# Patient Record
Sex: Male | Born: 1960 | Race: White | Hispanic: No | Marital: Married | Smoking: Never smoker
Health system: Southern US, Community
[De-identification: ages and names within clinical notes are randomized; demographics above are authoritative.]

## PROBLEM LIST (undated history)

## (undated) DIAGNOSIS — C801 Malignant (primary) neoplasm, unspecified: Secondary | ICD-10-CM

## (undated) DIAGNOSIS — E119 Type 2 diabetes mellitus without complications: Secondary | ICD-10-CM

## (undated) DIAGNOSIS — I10 Essential (primary) hypertension: Secondary | ICD-10-CM

## (undated) DIAGNOSIS — M1990 Unspecified osteoarthritis, unspecified site: Secondary | ICD-10-CM

## (undated) DIAGNOSIS — H409 Unspecified glaucoma: Secondary | ICD-10-CM

## (undated) DIAGNOSIS — N529 Male erectile dysfunction, unspecified: Secondary | ICD-10-CM

## (undated) DIAGNOSIS — R972 Elevated prostate specific antigen [PSA]: Secondary | ICD-10-CM

## (undated) DIAGNOSIS — E559 Vitamin D deficiency, unspecified: Secondary | ICD-10-CM

## (undated) DIAGNOSIS — E785 Hyperlipidemia, unspecified: Secondary | ICD-10-CM

## (undated) DIAGNOSIS — Z87442 Personal history of urinary calculi: Secondary | ICD-10-CM

## (undated) HISTORY — DX: Type 2 diabetes mellitus without complications: E11.9

## (undated) HISTORY — PX: APPENDECTOMY: SHX54

## (undated) HISTORY — DX: Unspecified glaucoma: H40.9

## (undated) HISTORY — DX: Essential (primary) hypertension: I10

## (undated) HISTORY — DX: Hyperlipidemia, unspecified: E78.5

## (undated) HISTORY — DX: Vitamin D deficiency, unspecified: E55.9

---

## 2003-09-07 ENCOUNTER — Emergency Department (HOSPITAL_COMMUNITY): Admission: EM | Admit: 2003-09-07 | Discharge: 2003-09-07 | Payer: Self-pay | Admitting: Emergency Medicine

## 2003-09-12 ENCOUNTER — Encounter (HOSPITAL_COMMUNITY): Admission: RE | Admit: 2003-09-12 | Discharge: 2003-10-12 | Payer: Self-pay | Admitting: Orthopedic Surgery

## 2003-10-30 ENCOUNTER — Encounter (HOSPITAL_COMMUNITY): Admission: RE | Admit: 2003-10-30 | Discharge: 2003-11-29 | Payer: Self-pay | Admitting: Orthopedic Surgery

## 2006-02-06 ENCOUNTER — Emergency Department (HOSPITAL_COMMUNITY): Admission: EM | Admit: 2006-02-06 | Discharge: 2006-02-06 | Payer: Self-pay | Admitting: Emergency Medicine

## 2006-10-04 ENCOUNTER — Ambulatory Visit: Payer: Self-pay | Admitting: Internal Medicine

## 2011-06-02 ENCOUNTER — Telehealth: Payer: Self-pay

## 2011-06-02 NOTE — Telephone Encounter (Signed)
LMOM for pt to call. 

## 2011-06-16 NOTE — Telephone Encounter (Signed)
Letter mailed for pt to call.  

## 2012-03-17 ENCOUNTER — Encounter (INDEPENDENT_AMBULATORY_CARE_PROVIDER_SITE_OTHER): Payer: Self-pay | Admitting: *Deleted

## 2013-08-28 ENCOUNTER — Encounter (INDEPENDENT_AMBULATORY_CARE_PROVIDER_SITE_OTHER): Payer: Self-pay | Admitting: *Deleted

## 2014-03-09 ENCOUNTER — Other Ambulatory Visit (HOSPITAL_COMMUNITY): Payer: Self-pay | Admitting: Internal Medicine

## 2014-03-09 DIAGNOSIS — K4090 Unilateral inguinal hernia, without obstruction or gangrene, not specified as recurrent: Secondary | ICD-10-CM

## 2014-03-09 DIAGNOSIS — N433 Hydrocele, unspecified: Secondary | ICD-10-CM

## 2014-03-12 ENCOUNTER — Ambulatory Visit (HOSPITAL_COMMUNITY): Admission: RE | Admit: 2014-03-12 | Payer: 59 | Source: Ambulatory Visit

## 2014-03-12 ENCOUNTER — Ambulatory Visit (HOSPITAL_COMMUNITY): Payer: 59

## 2014-03-12 ENCOUNTER — Other Ambulatory Visit (HOSPITAL_COMMUNITY): Payer: Self-pay | Admitting: Internal Medicine

## 2014-03-12 DIAGNOSIS — N433 Hydrocele, unspecified: Secondary | ICD-10-CM

## 2014-03-12 DIAGNOSIS — K4090 Unilateral inguinal hernia, without obstruction or gangrene, not specified as recurrent: Secondary | ICD-10-CM

## 2014-03-16 ENCOUNTER — Ambulatory Visit (HOSPITAL_COMMUNITY)
Admission: RE | Admit: 2014-03-16 | Discharge: 2014-03-16 | Disposition: A | Discharge status: Home / Self Care | Payer: 59 | Source: Ambulatory Visit | Attending: Internal Medicine | Admitting: Internal Medicine

## 2014-03-16 ENCOUNTER — Other Ambulatory Visit (HOSPITAL_COMMUNITY): Payer: Self-pay

## 2014-03-16 DIAGNOSIS — N509 Disorder of male genital organs, unspecified: Secondary | ICD-10-CM | POA: Diagnosis not present

## 2014-03-16 DIAGNOSIS — N433 Hydrocele, unspecified: Secondary | ICD-10-CM

## 2014-03-16 DIAGNOSIS — K4090 Unilateral inguinal hernia, without obstruction or gangrene, not specified as recurrent: Secondary | ICD-10-CM | POA: Insufficient documentation

## 2014-05-31 ENCOUNTER — Encounter (INDEPENDENT_AMBULATORY_CARE_PROVIDER_SITE_OTHER): Payer: Self-pay | Admitting: *Deleted

## 2014-06-27 ENCOUNTER — Other Ambulatory Visit (INDEPENDENT_AMBULATORY_CARE_PROVIDER_SITE_OTHER): Payer: Self-pay | Admitting: *Deleted

## 2014-06-27 ENCOUNTER — Encounter (INDEPENDENT_AMBULATORY_CARE_PROVIDER_SITE_OTHER): Payer: Self-pay | Admitting: *Deleted

## 2014-06-27 DIAGNOSIS — Z1211 Encounter for screening for malignant neoplasm of colon: Secondary | ICD-10-CM

## 2014-07-30 ENCOUNTER — Telehealth (INDEPENDENT_AMBULATORY_CARE_PROVIDER_SITE_OTHER): Payer: Self-pay | Admitting: *Deleted

## 2014-07-30 DIAGNOSIS — Z1211 Encounter for screening for malignant neoplasm of colon: Secondary | ICD-10-CM

## 2014-07-30 NOTE — Telephone Encounter (Signed)
Patient needs movi prep 

## 2014-08-02 ENCOUNTER — Telehealth (INDEPENDENT_AMBULATORY_CARE_PROVIDER_SITE_OTHER): Payer: Self-pay | Admitting: *Deleted

## 2014-08-02 MED ORDER — PEG-KCL-NACL-NASULF-NA ASC-C 100 G PO SOLR: 1.0000 | Freq: Once | ORAL | Status: DC

## 2014-08-02 NOTE — Telephone Encounter (Signed)
agree

## 2014-08-02 NOTE — Telephone Encounter (Signed)
Referring MD/PCP: hall   Procedure: tcs  Reason/Indication:  screening  Has patient had this procedure before?  no  If so, when, by whom and where?    Is there a family history of colon cancer?  no  Who?  What age when diagnosed?    Is patient diabetic?   no      Does patient have prosthetic heart valve?  no  Do you have a pacemaker?  no  Has patient ever had endocarditis? no  Has patient had joint replacement within last 12 months?  no  Does patient tend to be constipated or take laxatives? no  Is patient on Coumadin, Plavix and/or Aspirin? yes  Medications: asa 81 mg daily, valsartan/hctz 80/12.5 mg daily, fish oil daily, multi vit  Allergies: nkda  Medication Adjustment: asa 2 days  Procedure date & time: 08/29/14 at 830

## 2014-08-28 NOTE — OR Nursing (Signed)
Patient called and said he needed to cancel his colonoscopy for 08/29/2014 as he has a virus. Patient stated he would call the office when he is ready to reschedule. Lacretia Nicks at Dr. Olevia Perches office notified.

## 2014-08-29 ENCOUNTER — Encounter (HOSPITAL_COMMUNITY): Admission: RE | Payer: Self-pay | Source: Ambulatory Visit

## 2014-08-29 ENCOUNTER — Ambulatory Visit (HOSPITAL_COMMUNITY): Admission: RE | Admit: 2014-08-29 | Payer: 59 | Source: Ambulatory Visit | Admitting: Internal Medicine

## 2014-08-29 SURGERY — COLONOSCOPY
Anesthesia: Moderate Sedation

## 2019-02-22 ENCOUNTER — Encounter: Payer: Self-pay | Admitting: Internal Medicine

## 2019-03-22 ENCOUNTER — Encounter: Payer: Self-pay | Admitting: Internal Medicine

## 2019-03-22 ENCOUNTER — Telehealth: Payer: Self-pay | Admitting: Internal Medicine

## 2019-03-22 ENCOUNTER — Ambulatory Visit: Payer: 59 | Admitting: Gastroenterology

## 2019-03-22 NOTE — Telephone Encounter (Signed)
Patient was a no show and letter sent  °

## 2019-03-22 NOTE — Progress Notes (Deleted)
Primary Care Physician:  [facility] [place]  Primary Gastroenterologist:  Garfield Cornea, MD   No chief complaint on file.   HPI:  William Cervantes is a 58 y.o. male here   Current Outpatient Medications  Medication Sig Dispense Refill  . peg 3350 powder (MOVIPREP) 100 G SOLR Take 1 kit (200 g total) by mouth once. 1 kit 0   No current facility-administered medications for this visit.     Allergies as of 03/22/2019  . (Not on File)    No past medical history on file.  *** The histories are not reviewed yet. Please review them in the "History" navigator section and refresh this [place].  No family history on file.  Social History   Socioeconomic History  . Marital status: Married    Spouse name: Not on file  . Number of children: Not on file  . Years of education: Not on file  . Highest education level: Not on file  Occupational History  . Not on file  Social Needs  . Financial resource strain: Not on file  . Food insecurity    Worry: Not on file    Inability: Not on file  . Transportation needs    Medical: Not on file    Non-medical: Not on file  Tobacco Use  . Smoking status: Not on file  Substance and Sexual Activity  . Alcohol use: Not on file  . Drug use: Not on file  . Sexual activity: Not on file  Lifestyle  . Physical activity    Days per week: Not on file    Minutes per session: Not on file  . Stress: Not on file  Relationships  . Social Herbalist on phone: Not on file    Gets together: Not on file    Attends religious service: Not on file    Active member of club or organization: Not on file    Attends meetings of clubs or organizations: Not on file    Relationship status: Not on file  . Intimate partner violence    Fear of current or ex partner: Not on file    Emotionally abused: Not on file    Physically abused: Not on file    Forced sexual activity: Not on file  Other Topics Concern  . Not on file  Social History  Narrative  . Not on file      ROS:  General: Negative for anorexia, weight loss, fever, chills, fatigue, weakness. Eyes: Negative for vision changes.  ENT: Negative for hoarseness, difficulty swallowing , nasal congestion. CV: Negative for chest pain, angina, palpitations, dyspnea on exertion, peripheral edema.  Respiratory: Negative for dyspnea at rest, dyspnea on exertion, cough, sputum, wheezing.  GI: See history of present illness. GU:  Negative for dysuria, hematuria, urinary incontinence, urinary frequency, nocturnal urination.  MS: Negative for joint pain, low back pain.  Derm: Negative for rash or itching.  Neuro: Negative for weakness, abnormal sensation, seizure, frequent headaches, memory loss, confusion.  Psych: Negative for anxiety, depression, suicidal ideation, hallucinations.  Endo: Negative for unusual weight change.  Heme: Negative for bruising or bleeding. Allergy: Negative for rash or hives.    Physical Examination:  There were no vitals taken for this visit.   General: Well-nourished, well-developed in no acute distress.  Head: Normocephalic, atraumatic.   Eyes: Conjunctiva pink, no icterus. Mouth: Oropharyngeal mucosa moist and pink , no lesions erythema or exudate. Neck: Supple without thyromegaly, masses, or lymphadenopathy.  Lungs: Clear to auscultation bilaterally.  Heart: Regular rate and rhythm, no murmurs rubs or gallops.  Abdomen: Bowel sounds are normal, nontender, nondistended, no hepatosplenomegaly or masses, no abdominal bruits or    hernia , no rebound or guarding.   Rectal: *** Extremities: No lower extremity edema. No clubbing or deformities.  Neuro: Alert and oriented x 4 , grossly normal neurologically.  Skin: Warm and dry, no rash or jaundice.   Psych: Alert and cooperative, normal mood and affect.  Labs: ***  Imaging Studies: No results found.

## 2019-04-21 ENCOUNTER — Other Ambulatory Visit: Payer: Self-pay

## 2019-04-21 ENCOUNTER — Ambulatory Visit (INDEPENDENT_AMBULATORY_CARE_PROVIDER_SITE_OTHER): Payer: 59 | Admitting: Gastroenterology

## 2019-04-21 ENCOUNTER — Encounter: Payer: Self-pay | Admitting: Gastroenterology

## 2019-04-21 DIAGNOSIS — Z1211 Encounter for screening for malignant neoplasm of colon: Secondary | ICD-10-CM | POA: Insufficient documentation

## 2019-04-21 DIAGNOSIS — K769 Liver disease, unspecified: Secondary | ICD-10-CM | POA: Diagnosis not present

## 2019-04-21 NOTE — Progress Notes (Signed)
Primary Care Physician:  [facility] [place], Elisabeth Most Requesting provider: Cynda Familia Primary Gastroenterologist:  Garfield Cornea, MD   Chief Complaint  Patient presents with  . abnormal liver imaging    HPI:  William Cervantes is a 58 y.o. male here at the request of Cynda Familia, [place], for further evaluation of abnormal liver imaging.  Patient states he has had some intermittent left flank pain which initially began about 2 years ago.  He had to visit a [place] center in [place] when he was on vacation.  He states he was told that he may have kidney stones based on x-rays.  He followed up at the [facility], saw urology.  Advised that he had an enlarged prostate.  Ultrasound abd? was unremarkable.   Recently switched from [facility] to [place].  PCP there recommended CT abdomen and pelvis without contrast.  CT done on August 2020, it showed mild nodular contour of the liver which can be seen with cirrhosis.  Tiny calcified gallstones.  Severe sigmoid colon diverticulosis.  Engorged left retroperitoneal vessels communicating with the left renal vein, left gonadal vein, splenic vein suggesting portosystemic shunting.  States he was advised to follow-up with GI for further evaluation of his liver.  In the meantime it was noted that his A1c had climbed to 7.9.  He states he started walking a mile every day, watching his diet.  His last A1c was 6.6.  He states he is lost about 5 to 6 pounds.  He denies any abdominal pain.  His appetite is good.  No bowel concerns.  No melena or rectal bleeding.  No heartburn or dysphagia.  No nausea or vomiting. No family history of colon cancer, liver disease.  No prior regular heavy alcohol use.  No history of drug use.  Patient has never had a colonoscopy.  He has been willing for colon cancer screening to be Hemoccults.  He has completed these in the past stating they were negative.  He states he is overdue now.  We discussed  possibility of Cologuard testing.  He states he is willing to pursue colonoscopy if one of the screening exams were positive.    Patient states Lipitor was recently started.  His LDL has been normal.  He believes it was started as a preventative medication.   Current Outpatient Medications  Medication Sig Dispense Refill  . aspirin EC 81 MG tablet Take 81 mg by mouth daily.    Marland Kitchen atorvastatin (LIPITOR) 20 MG tablet Take 1 tablet by mouth daily.    . diclofenac (VOLTAREN) 75 MG EC tablet Take 75 mg by mouth 2 (two) times daily as needed.    . Ergocalciferol (VITAMIN D2) 10 MCG (400 UNIT) TABS Take by mouth daily.    Marland Kitchen glipiZIDE (GLUCOTROL) 10 MG tablet Take 10 mg by mouth 2 (two) times daily before a meal.    . hydrochlorothiazide (HYDRODIURIL) 25 MG tablet Take 0.5 tablets by mouth daily.    Marland Kitchen losartan (COZAAR) 25 MG tablet Take 1 tablet by mouth daily.    . metFORMIN (GLUCOPHAGE) 1000 MG tablet Take 1 tablet by mouth 2 (two) times daily.    . Multiple Vitamins-Minerals (MULTIVITAMIN ADULTS 50+ PO) Take by mouth daily.    . Omega-3 Fatty Acids (FISH OIL ULTRA) 1400 MG CAPS Take by mouth daily.    . tamsulosin (FLOMAX) 0.4 MG CAPS capsule Take 1 capsule by mouth daily.    . TRAVATAN Z 0.004 % SOLN  ophthalmic solution Place 1 drop into both eyes at bedtime.     No current facility-administered medications for this visit.     Allergies as of 04/21/2019 - Review Complete 04/21/2019  Allergen Reaction Noted  . Sulfa antibiotics Swelling 04/21/2019    Past Medical History:  Diagnosis Date  . Diabetes mellitus ([place])   . Glaucoma   . Hyperlipidemia   . Hypertension   . Vitamin D deficiency     Past Surgical History:  Procedure Laterality Date  . APPENDECTOMY      Family History  Problem Relation Age of Onset  . Diabetes Mother   . Hypertension Father   . Heart attack Father        passed away at 38 with massive heart attack  . Colon cancer Neg Hx   . Liver disease Neg Hx      Social History   Socioeconomic History  . Marital status: Married    Spouse name: Not on file  . Number of children: Not on file  . Years of education: Not on file  . Highest education level: Not on file  Occupational History  . Not on file  Social Needs  . Financial resource strain: Not on file  . Food insecurity    Worry: Not on file    Inability: Not on file  . Transportation needs    Medical: Not on file    Non-medical: Not on file  Tobacco Use  . Smoking status: Never Smoker  . Smokeless tobacco: Never Used  Substance and Sexual Activity  . Alcohol use: Yes    Comment: occ beer, never had heavy use.   . Drug use: Never  . Sexual activity: Not on file  Lifestyle  . Physical activity    Days per week: Not on file    Minutes per session: Not on file  . Stress: Not on file  Relationships  . Social Herbalist on phone: Not on file    Gets together: Not on file    Attends religious service: Not on file    Active member of club or organization: Not on file    Attends meetings of clubs or organizations: Not on file    Relationship status: Not on file  . Intimate partner violence    Fear of current or ex partner: Not on file    Emotionally abused: Not on file    Physically abused: Not on file    Forced sexual activity: Not on file  Other Topics Concern  . Not on file  Social History Narrative  . Not on file      ROS:  General: Negative for anorexia, weight loss, fever, chills, fatigue, weakness. Eyes: Negative for vision changes.  ENT: Negative for hoarseness, difficulty swallowing , nasal congestion. CV: Negative for chest pain, angina, palpitations, dyspnea on exertion, peripheral edema.  Respiratory: Negative for dyspnea at rest, dyspnea on exertion, cough, sputum, wheezing.  GI: See history of present illness. GU:  Negative for dysuria, hematuria, urinary incontinence, urinary frequency, nocturnal urination.  MS: Negative for joint pain, low  back pain.  Derm: Negative for rash or itching.  Neuro: Negative for weakness, abnormal sensation, seizure, frequent headaches, memory loss, confusion.  Psych: Negative for anxiety, depression, suicidal ideation, hallucinations.  Endo: Negative for unusual weight change.  Heme: Negative for bruising or bleeding. Allergy: Negative for rash or hives.    Physical Examination:  BP 138/84   Pulse 74   Temp Marland Kitchen)  97.3 F (36.3 C) (Temporal)   Ht 5\' 10"  (1.778 m)   Wt 261 lb 6.4 oz (118.6 kg)   BMI 37.51 kg/m    General: Well-nourished, well-developed in no acute distress.  Head: Normocephalic, atraumatic.   Eyes: Conjunctiva pink, no icterus. Mouth: Oropharyngeal mucosa moist and pink , no lesions erythema or exudate. Neck: Supple without thyromegaly, masses, or lymphadenopathy.  Lungs: Clear to auscultation bilaterally.  Heart: Regular rate and rhythm, no murmurs rubs or gallops.  Abdomen: Bowel sounds are normal, nontender, nondistended, no hepatosplenomegaly or masses, no abdominal bruits or    hernia , no rebound or guarding.   Rectal: Not performed Extremities: No lower extremity edema. No clubbing or deformities.  Neuro: Alert and oriented x 4 , grossly normal neurologically.  Skin: Warm and dry, no rash or jaundice.   Psych: Alert and cooperative, normal mood and affect.  Labs: June 2020: ALT 54 normal, platelets 173,000 normal, LDL 97 normal, SGOT 38, sodium 137, total bilirubin 0.8, white blood cell count 5.33, glucose 170, hematocrit 43.1, hemoglobin 14.6, A1c 7.9  Imaging Studies:  CT abdomen pelvis without contrast mild nodular contour of the liver which can be seen with cirrhosis.  Tiny calcified gallstones.  Severe sigmoid colon diverticulosis.  Engorged left retroperitoneal vessels communicating with the left renal vein, left gonadal vein, splenic vein suggesting portosystemic shunting.

## 2019-04-21 NOTE — Assessment & Plan Note (Signed)
Patient states he never had a colonoscopy.  He has had stool I FOBT is done in the past.  We discussed possibility of Cologuard testing as the sensitivity is higher than with I FOBT.  Patient also states that if it were positive and he would agree to colonoscopy.  We discussed with patient today that we would need to get approval through the [place] system as his referral was not pertaining to colon cancer screening.

## 2019-04-21 NOTE — Patient Instructions (Signed)
1. We will be in touch once we verify if the [place] will cover you to have blood work done locally and for Cologuard testing.  2. Keep up the good work with diet and daily exercise. 3. I suspect you have some underlying liver disease likely related to chronic diabetes, fatty liver.  Handout provided.  We will be ruling out other potential sources of liver disease with blood work.   Fatty Liver Disease  Fatty liver disease occurs when too much fat has built up in your liver cells. Fatty liver disease is also called hepatic steatosis or steatohepatitis. The liver removes harmful substances from your bloodstream and produces fluids that your body needs. It also helps your body use and store energy from the food you eat. In many cases, fatty liver disease does not cause symptoms or problems. It is often diagnosed when tests are being done for other reasons. However, over time, fatty liver can cause inflammation that may lead to more serious liver problems, such as scarring of the liver (cirrhosis) and liver failure. Fatty liver is associated with insulin resistance, increased body fat, high blood pressure (hypertension), and high cholesterol. These are features of metabolic syndrome and increase your risk for stroke, diabetes, and heart disease. What are the causes? This condition may be caused by:  Drinking too much alcohol.  Poor nutrition.  Obesity.  Cushing's syndrome.  Diabetes.  High cholesterol.  Certain drugs.  Poisons.  Some viral infections.  Pregnancy. What increases the risk? You are more likely to develop this condition if you:  Abuse alcohol.  Are overweight.  Have diabetes.  Have hepatitis.  Have a high triglyceride level.  Are pregnant. What are the signs or symptoms? Fatty liver disease often does not cause symptoms. If symptoms do develop, they can include:  Fatigue.  Weakness.  Weight loss.  Confusion.  Abdominal pain.  Nausea and  vomiting.  Yellowing of your skin and the white parts of your eyes (jaundice).  Itchy skin. How is this diagnosed? This condition may be diagnosed by:  A physical exam and medical history.  Blood tests.  Imaging tests, such as an ultrasound, CT scan, or MRI.  A liver biopsy. A small sample of liver tissue is removed using a needle. The sample is then looked at under a microscope. How is this treated? Fatty liver disease is often caused by other health conditions. Treatment for fatty liver may involve medicines and lifestyle changes to manage conditions such as:  Alcoholism.  High cholesterol.  Diabetes.  Being overweight or obese. Follow these instructions at home:   Do not drink alcohol. If you have trouble quitting, ask your health care provider how to safely quit with the help of medicine or a supervised program. This is important to keep your condition from getting worse.  Eat a healthy diet as told by your health care provider. Ask your health care provider about working with a diet and nutrition specialist (dietitian) to develop an eating plan.  Exercise regularly. This can help you lose weight and control your cholesterol and diabetes. Talk to your health care provider about an exercise plan and which activities are best for you.  Take over-the-counter and prescription medicines only as told by your health care provider.  Keep all follow-up visits as told by your health care provider. This is important. Contact a health care provider if: You have trouble controlling your:  Blood sugar. This is especially important if you have diabetes.  Cholesterol.  Drinking  of alcohol. Get help right away if:  You have abdominal pain.  You have jaundice.  You have nausea and vomiting.  You vomit blood or material that looks like coffee grounds.  You have stools that are black, tar-like, or bloody. Summary  Fatty liver disease develops when too much fat builds up in  the cells of your liver.  Fatty liver disease often causes no symptoms or problems. However, over time, fatty liver can cause inflammation that may lead to more serious liver problems, such as scarring of the liver (cirrhosis).  You are more likely to develop this condition if you abuse alcohol, are pregnant, are overweight, have diabetes, have hepatitis, or have high triglyceride levels.  Contact your health care provider if you have trouble controlling your weight, blood sugar, cholesterol, or drinking of alcohol. This information is not intended to replace advice given to you by your health care provider. Make sure you discuss any questions you have with your health care provider. Document Released: 07/31/2005 Document Revised: 05/28/2017 Document Reviewed: 03/24/2017 Elsevier Patient Education  2020 [place].  Nonalcoholic Fatty Liver Disease Diet, Adult Nonalcoholic fatty liver disease is a condition that causes fat to build up in and around the liver. The disease makes it harder for the liver to work the way that it should. Following a healthy diet can help to keep nonalcoholic fatty liver disease under control. It can also help to prevent or improve conditions that are associated with the disease, such as heart disease, diabetes, high blood pressure, and abnormal cholesterol levels. Along with regular exercise, this diet:  Promotes weight loss.  Helps to control blood sugar levels.  Helps to improve the way that the body uses insulin. What are tips for following this plan? Reading food labels Always check food labels for:  The amount of saturated fat in a food. You should limit your intake of saturated fat. Saturated fat is found in foods that come from animals, including meat and dairy products such as butter, cheese, and whole milk.  The amount of fiber in a food. You should choose high-fiber foods such as fruits, vegetables, and whole grains. Try to get 25-30 grams (g) of  fiber a day.  Cooking  When cooking, use heart-healthy oils that are high in monounsaturated fats. These include olive oil, canola oil, and avocado oil.  Limit frying or deep-frying foods. Cook foods using healthy methods such as baking, boiling, steaming, and grilling instead. Meal planning  You may want to keep track of how many calories you take in. Eating the right amount of calories will help you achieve a healthy weight. Meeting with a registered dietitian can help you get started.  Limit how often you eat takeout and fast food. These foods are usually very high in fat, salt, and sugar.  Use the glycemic index (GI) to plan your meals. The index tells you how quickly a food will raise your blood sugar. Choose low-GI foods (GI less than 55). These foods take a longer time to raise blood sugar. A registered dietitian can help you identify foods lower on the GI scale. Lifestyle  You may want to follow a Mediterranean diet. This diet includes a lot of vegetables, lean meats or fish, whole grains, fruits, and healthy oils and fats. What foods can I eat?  Fruits Bananas. Apples. Oranges. Grapes. Papaya. Mango. Pomegranate. Kiwi. Grapefruit. Cherries. Vegetables Lettuce. Spinach. Peas. Beets. Cauliflower. Cabbage. Broccoli. Carrots. Tomatoes. Squash. Eggplant. Herbs. Peppers. Onions. Cucumbers. Brussels sprouts. Yams  and sweet potatoes. Beans. Lentils. Grains Whole wheat or whole-grain foods, including breads, crackers, cereals, and pasta. Stone-ground whole wheat. Unsweetened oatmeal. Bulgur. Barley. Quinoa. Brown or wild rice. Corn or whole wheat flour tortillas. Meats and other proteins Lean meats. Poultry. Tofu. Seafood and shellfish. Dairy Low-fat or fat-free dairy products, such as yogurt, cottage cheese, or cheese. Beverages Water. Sugar-free drinks. Tea. Coffee. Low-fat or skim milk. Milk alternatives, such as soy or almond milk. Real fruit juice. Fats and oils Avocado. Canola  or olive oil. Nuts and nut butters. Seeds. Seasonings and condiments Mustard. Relish. Low-fat, low-sugar ketchup and barbecue sauce. Low-fat or fat-free mayonnaise. Sweets and desserts Sugar-free sweets. The items listed above may not be a complete list of foods and beverages you can eat. Contact a dietitian for more information. What foods should I limit or avoid? Meats and other proteins Limit red meat to 1-2 times a week. Dairy [employer]. Fats and oils Palm oil and coconut oil. Fried foods. Other foods Processed foods. Foods that contain a lot of salt or sodium. Sweets and desserts Sweets that contain sugar. Beverages Sweetened drinks, such as sweet tea, milkshakes, iced sweet drinks, and sodas. Alcohol. The items listed above may not be a complete list of foods and beverages you should avoid. Contact a dietitian for more information. Where to find more information The [employer] of Diabetes and Digestive and Kidney Diseases: [employer] Summary  Nonalcoholic fatty liver disease is a condition that causes fat to build up in and around the liver.  Following a healthy diet can help to keep nonalcoholic fatty liver disease under control. Your diet should be rich in fruits, vegetables, whole grains, and lean proteins.  Limit your intake of saturated fat. Saturated fat is found in foods that come from animals, including meat and dairy products such as butter, cheese, and whole milk.  This diet promotes weight loss, helps to control blood sugar levels, and helps to improve the way that the body uses insulin. This information is not intended to replace advice given to you by your health care provider. Make sure you discuss any questions you have with your health care provider. Document Released: 10/30/2014 Document Revised: 10/07/2018 Document Reviewed: 07/07/2018 Elsevier Patient Education  2020 [employer].

## 2019-04-21 NOTE — Assessment & Plan Note (Signed)
Very pleasant 58 year old gentleman with history of diabetes, obesity presenting for further evaluation of abnormal liver imaging.  CT without contrast in August 2020 showed mild nodular contour of the liver.  He had some engorged left retroperitoneal vessels communicating with the left renal vein, left gonadal vein, splenic vein suggesting portosystemic shunting.  Patient reports his LFTs have been normal.  Last ones in June were normal as well.  We discussed that he may have some early scarring/cirrhosis related to [place].  He is at risk due to history of obesity and diabetes.  It would be imperative to rule out some other etiologies via labs.  We will also screen for hepatitis A and B immunity.  Encouraged him to continue his dietary changes, continue daily exercise for better control of his diabetes and his liver disease.  We will work towards getting labs done, patient asked that we make sure that he can have this done locally and be covered by the [place].

## 2019-04-25 ENCOUNTER — Telehealth: Payer: Self-pay

## 2019-04-25 NOTE — Telephone Encounter (Signed)
T/C from [facility], said she spoke to [place] and she said we would have to call [employer] to find out if the labs and COLOGUARD would be covered. The number to call is : [phone].

## 2019-04-25 NOTE — Telephone Encounter (Signed)
I have called the [place] @ [phone] to ask if Cologuard and local labs would be covered by the [place].  Left a message and Nira Conn, the scheduler is to return my call.

## 2019-05-03 NOTE — Telephone Encounter (Signed)
What is status? I was following up on this one and noted this phone note. Was not routed to me.   Please let pt know we are still waiting for information about coverage.   Shouldn't labs be covered for the liver stuff (they referred him to [place] for management).

## 2019-05-04 ENCOUNTER — Other Ambulatory Visit: Payer: Self-pay

## 2019-05-04 DIAGNOSIS — K7460 Unspecified cirrhosis of liver: Secondary | ICD-10-CM

## 2019-05-04 NOTE — Telephone Encounter (Addendum)
Please arrange for the following labs: Dx: cirrhosis  Hepatitis A total antibody Hepatitis B core total antibody Hepatitis B surface antigen Hepatitis B surface antibody Hepatitis C antibody Iron/TIBC/ferritin LFTs PT/INR CBC ANA, AMA, ASMA IgG/IgM/IgA Tissue transglutimase Ceruloplasmin Alpha antitrypsin deficiency Fibrosure OR Fibrotest  COLOGUARD: Dx: average risk colon cancer screening.

## 2019-05-04 NOTE — Telephone Encounter (Signed)
I called [employer] and spoke to [place]. She look up pt and gave me a case # [id number]. She said she was not able to see what is covered. She gave me a [employer] direct line [phone] opt [phone].  I was on the phone for 20 min total and got disconnected.

## 2019-05-04 NOTE — Telephone Encounter (Signed)
Per Vaughan Basta at [place] [facility], we can enter the orders for pt and he can bring his [place] card with him to do the labs. He would be responsible if [facility] did not pay. We have documentation of labs being covered.

## 2019-05-04 NOTE — Telephone Encounter (Signed)
I called [facility] again and spoke to [facility] who referred pt. The labs are covered  ( see list of service/procedures on referral paperwork). #4 Labs and pathology relevant to the referred condition on the consult order. She looked up the Cologuard and said the National CPT code is 8152 A and would be covered. She is faxing me that info. Pt is aware we are working on this.

## 2019-05-08 ENCOUNTER — Other Ambulatory Visit: Payer: Self-pay | Admitting: Gastroenterology

## 2019-05-08 ENCOUNTER — Other Ambulatory Visit: Payer: Self-pay

## 2019-05-08 DIAGNOSIS — K7460 Unspecified cirrhosis of liver: Secondary | ICD-10-CM

## 2019-05-08 NOTE — Addendum Note (Signed)
Addended by: Mahala Menghini on: 05/08/2019 05:33 PM   Modules accepted: Orders

## 2019-05-08 NOTE — Telephone Encounter (Signed)
Orders finalized, corrected.

## 2019-05-08 NOTE — Telephone Encounter (Signed)
William Cervantes, please see staff message I sent to you.

## 2019-05-09 NOTE — Telephone Encounter (Signed)
Pt is aware I have lab orders ready for him and he will be by to pick up to assure the Reference # from the [place] is on the labs. He is aware he does not have to be fasting.  He also said [facility] sent him a cologuard and he is sending it back today.

## 2019-05-11 NOTE — Telephone Encounter (Signed)
noted 

## 2019-05-13 LAB — LIVER FIBROSIS, FIBROTEST-ACTITEST
ALT: 30 U/L (ref 9–46)
Alpha-2-Macroglobulin: 357 mg/dL — ABNORMAL HIGH (ref 106–279)
Apolipoprotein A1: 137 mg/dL (ref 94–176)
Bilirubin: 0.6 mg/dL (ref 0.2–1.2)
Fibrosis Score: 0.57
GGT: 23 U/L (ref 3–70)
Haptoglobin: 134 mg/dL (ref 43–212)
Necroinflammat ACT Score: 0.21
Reference ID: 3159570

## 2019-05-13 LAB — IGG, IGA, IGM
IgG (Immunoglobin G), Serum: 1713 mg/dL — ABNORMAL HIGH (ref 600–1640)
IgM, Serum: 84 mg/dL (ref 50–300)
Immunoglobulin A: 340 mg/dL — ABNORMAL HIGH (ref 47–310)

## 2019-05-13 LAB — CBC WITH DIFFERENTIAL/PLATELET
Absolute Monocytes: 570 cells/uL (ref 200–950)
Basophils Absolute: 51 cells/uL (ref 0–200)
Basophils Relative: 0.8 %
Eosinophils Absolute: 160 cells/uL (ref 15–500)
Eosinophils Relative: 2.5 %
HCT: 42.6 % (ref 38.5–50.0)
Hemoglobin: 14.8 g/dL (ref 13.2–17.1)
Lymphs Abs: 1818 cells/uL (ref 850–3900)
MCH: 31.7 pg (ref 27.0–33.0)
MCHC: 34.7 g/dL (ref 32.0–36.0)
MCV: 91.2 fL (ref 80.0–100.0)
MPV: 10.2 fL (ref 7.5–12.5)
Monocytes Relative: 8.9 %
Neutro Abs: 3802 cells/uL (ref 1500–7800)
Neutrophils Relative %: 59.4 %
Platelets: 180 10*3/uL (ref 140–400)
RBC: 4.67 10*6/uL (ref 4.20–5.80)
RDW: 12.1 % (ref 11.0–15.0)
Total Lymphocyte: 28.4 %
WBC: 6.4 10*3/uL (ref 3.8–10.8)

## 2019-05-13 LAB — HEPATIC FUNCTION PANEL
AG Ratio: 1.1 (calc) (ref 1.0–2.5)
ALT: 31 U/L (ref 9–46)
AST: 26 U/L (ref 10–35)
Albumin: 3.9 g/dL (ref 3.6–5.1)
Alkaline phosphatase (APISO): 69 U/L (ref 35–144)
Bilirubin, Direct: 0.2 mg/dL (ref 0.0–0.2)
Globulin: 3.4 g/dL (calc) (ref 1.9–3.7)
Indirect Bilirubin: 0.4 mg/dL (calc) (ref 0.2–1.2)
Total Bilirubin: 0.6 mg/dL (ref 0.2–1.2)
Total Protein: 7.3 g/dL (ref 6.1–8.1)

## 2019-05-13 LAB — HEPATITIS A ANTIBODY, TOTAL: Hepatitis A AB,Total: NONREACTIVE

## 2019-05-13 LAB — IRON,TIBC AND FERRITIN PANEL
%SAT: 44 % (calc) (ref 20–48)
Ferritin: 182 ng/mL (ref 24–380)
Iron: 142 ug/dL (ref 50–180)
TIBC: 325 mcg/dL (calc) (ref 250–425)

## 2019-05-13 LAB — HEPATITIS B SURFACE ANTIGEN: Hepatitis B Surface Ag: NONREACTIVE

## 2019-05-13 LAB — HEPATITIS C ANTIBODY
Hepatitis C Ab: NONREACTIVE
SIGNAL TO CUT-OFF: 0.02 (ref <1.00)

## 2019-05-13 LAB — ANTI-SMOOTH MUSCLE ANTIBODY, IGG: Actin (Smooth Muscle) Antibody (IGG): <20 U (ref <20)

## 2019-05-13 LAB — MITOCHONDRIAL ANTIBODIES: Mitochondrial M2 Ab, IgG: <=20 U

## 2019-05-13 LAB — CERULOPLASMIN: Ceruloplasmin: 24 mg/dL (ref 18–36)

## 2019-05-13 LAB — PROTIME-INR
INR: 1.1
Prothrombin Time: 11.5 s (ref 9.0–11.5)

## 2019-05-13 LAB — HEPATITIS B SURFACE ANTIBODY,QUALITATIVE: Hep B S Ab: NONREACTIVE

## 2019-05-13 LAB — TISSUE TRANSGLUTAMINASE, IGA: (tTG) Ab, IgA: 1 U/mL

## 2019-05-13 LAB — ANA: Anti Nuclear Antibody (ANA): NEGATIVE

## 2019-05-13 LAB — ALPHA-1-ANTITRYPSIN: A-1 Antitrypsin, Ser: 128 mg/dL (ref 83–199)

## 2019-05-13 LAB — HEPATITIS B CORE ANTIBODY, TOTAL: Hep B Core Total Ab: NONREACTIVE

## 2019-06-07 ENCOUNTER — Telehealth: Payer: Self-pay

## 2019-06-07 NOTE — Telephone Encounter (Signed)
Pt called and said the [facility] will need the RX order of Hep A & B vaccinations faxed to them. Is it ok to fax order. New order has been written and ready to fax per LSL's approval.  [place] fax [phone] attention pharmacy. [place],

## 2019-06-08 NOTE — Telephone Encounter (Signed)
Ok to provide Hep A/B vaccination order

## 2019-06-09 NOTE — Telephone Encounter (Signed)
Order faxed to the [facility] hospital. Pt is aware. If pt doesn't hear back from the [place], pt will contact our office.

## 2019-06-13 NOTE — Telephone Encounter (Signed)
Re- faxed RX for Hep A & B vaccination to the [place].

## 2019-06-20 ENCOUNTER — Telehealth: Payer: Self-pay | Admitting: Internal Medicine

## 2019-06-20 NOTE — Telephone Encounter (Signed)
I have cc'ed the OV notes and labs to [facility] and [facility] has also forwarded all records to patient.

## 2019-10-02 ENCOUNTER — Other Ambulatory Visit (HOSPITAL_COMMUNITY): Payer: Self-pay | Admitting: Internal Medicine

## 2019-10-02 ENCOUNTER — Other Ambulatory Visit: Payer: Self-pay

## 2019-10-02 ENCOUNTER — Ambulatory Visit (HOSPITAL_COMMUNITY)
Admission: RE | Admit: 2019-10-02 | Discharge: 2019-10-02 | Disposition: A | Discharge status: Home / Self Care | Payer: 59 | Source: Ambulatory Visit | Attending: Internal Medicine | Admitting: Internal Medicine

## 2019-10-02 ENCOUNTER — Encounter (HOSPITAL_COMMUNITY): Payer: Self-pay

## 2019-10-02 DIAGNOSIS — R0782 Intercostal pain: Secondary | ICD-10-CM | POA: Insufficient documentation

## 2019-10-12 ENCOUNTER — Encounter: Payer: Self-pay | Admitting: Internal Medicine

## 2020-02-08 ENCOUNTER — Telehealth: Payer: Self-pay | Admitting: Internal Medicine

## 2020-02-08 NOTE — Telephone Encounter (Signed)
n November 2020, [place] spoke with the pt and he told her ( per her documentation) that the [place] had sent him a cologuard and he was going to complete it and send it in. Cologuards are sent in to the company that makes them for processing. We never got any results that I can see in the chart.

## 2020-02-08 NOTE — Telephone Encounter (Signed)
[facility] faxed a request for records to [place] and asking if a cologuard test was done. Can you check behind me and see if one was ever done.

## 2020-02-25 NOTE — Telephone Encounter (Signed)
noted 

## 2021-01-12 ENCOUNTER — Ambulatory Visit
Admission: EM | Admit: 2021-01-12 | Discharge: 2021-01-12 | Disposition: A | Discharge status: Home / Self Care | Payer: 59 | Attending: Family Medicine | Admitting: Family Medicine

## 2021-01-12 ENCOUNTER — Encounter: Payer: Self-pay | Admitting: Emergency Medicine

## 2021-01-12 DIAGNOSIS — J0140 Acute pansinusitis, unspecified: Secondary | ICD-10-CM

## 2021-01-12 MED ORDER — PROMETHAZINE-DM 6.25-15 MG/5ML PO SYRP: 5.0000 mL | ORAL_SOLUTION | Freq: Four times a day (QID) | ORAL | 0 refills | Status: DC | PRN

## 2021-01-12 MED ORDER — AMOXICILLIN 875 MG PO TABS: 875.0000 mg | ORAL_TABLET | Freq: Two times a day (BID) | ORAL | 0 refills | Status: DC

## 2021-01-12 NOTE — ED Provider Notes (Signed)
[facility]    CSN: [id number] Arrival date & time: 01/12/21  1038      History   Chief Complaint No chief complaint on file.   HPI William Cervantes is a 60 y.o. male.   HPI Patient presents with URI symptoms including cough, sore throat, nasal congestion, and sinus pressure. Patient tests 3 x per week for COVID at work and all have remained negative. Symptoms present > 1 week.Denies worrisome symptoms of shortness of breath, weakness, N&V, or chest pain. Past Medical History:  Diagnosis Date   Diabetes mellitus ([place])    Glaucoma    Hyperlipidemia    Hypertension    Vitamin D deficiency     Patient Active Problem List   Diagnosis Date Noted   Chronic liver disease 04/21/2019   Colon cancer screening 04/21/2019    Past Surgical History:  Procedure Laterality Date   APPENDECTOMY         Home Medications    Prior to Admission medications   Medication Sig Start Date End Date Taking? Authorizing Provider  amoxicillin (AMOXIL) 875 MG tablet Take 1 tablet (875 mg total) by mouth 2 (two) times daily. 01/12/21  Yes Scot Jun, FNP  promethazine-dextromethorphan (PROMETHAZINE-DM) 6.25-15 MG/5ML syrup Take 5 mLs by mouth 4 (four) times daily as needed for cough. 01/12/21  Yes Scot Jun, FNP  aspirin EC 81 MG tablet Take 81 mg by mouth daily.    [provider]  atorvastatin (LIPITOR) 20 MG tablet Take 1 tablet by mouth daily. 03/17/19   [provider]  diclofenac (VOLTAREN) 75 MG EC tablet Take 75 mg by mouth 2 (two) times daily as needed.    [provider]  Ergocalciferol (VITAMIN D2) 10 MCG (400 UNIT) TABS Take by mouth daily.    [provider]  glipiZIDE (GLUCOTROL) 10 MG tablet Take 10 mg by mouth 2 (two) times daily before a meal.    [provider]  hydrochlorothiazide (HYDRODIURIL) 25 MG tablet Take 0.5 tablets by mouth daily. 03/17/19   [provider]  losartan (COZAAR) 25 MG tablet  Take 1 tablet by mouth daily. 03/19/19   [provider]  metFORMIN (GLUCOPHAGE) 1000 MG tablet Take 1 tablet by mouth 2 (two) times daily. 03/21/19   [provider]  Multiple Vitamins-Minerals (MULTIVITAMIN ADULTS 50+ PO) Take by mouth daily.    [provider]  Omega-3 Fatty Acids (FISH OIL ULTRA) 1400 MG CAPS Take by mouth daily.    [provider]  tamsulosin (FLOMAX) 0.4 MG CAPS capsule Take 1 capsule by mouth daily. 03/01/19   [provider]  TRAVATAN Z 0.004 % SOLN ophthalmic solution Place 1 drop into both eyes at bedtime. 03/03/19   [provider]    Family History Family History  Problem Relation Age of Onset   Diabetes Mother    Hypertension Father    Heart attack Father        passed away at 12 with massive heart attack   Colon cancer Neg Hx    Liver disease Neg Hx     Social History Social History   Tobacco Use   Smoking status: Never   Smokeless tobacco: Never  Substance Use Topics   Alcohol use: Yes    Comment: occ beer, never had heavy use.    Drug use: Never     Allergies   Sulfa antibiotics   Review of Systems Review of Systems Pertinent negatives listed in HPI  Physical Exam Triage Vital Signs ED Triage Vitals [01/12/21 1149]  Enc Vitals Group     BP      Pulse      Resp      Temp      Temp src      SpO2      Weight      Height      Head Circumference      Peak Flow      Pain Score 0     Pain Loc      Pain Edu?      Excl. in [place]?    No data found.  Updated Vital Signs BP 136/84 (BP Location: Right Arm)   Pulse 87   Temp 98.9 F (37.2 C) (Oral)   Resp 17   SpO2 98%   Visual Acuity Right Eye Distance:   Left Eye Distance:   Bilateral Distance:    Right Eye Near:   Left Eye Near:    Bilateral Near:     Physical Exam  General Appearance:    Alert, cooperative, no distress  HENT:   Normocephalic, ears normal, nares mucosal edema with congestion, rhinorrhea, oropharynx     Eyes:    PERRL, conjunctiva/corneas clear, EOM's intact       Lungs:     Clear to auscultation bilaterally, respirations unlabored  Heart:    Regular rate and rhythm  Neurologic:   Awake, alert, oriented x 3. No apparent focal neurological  deficit      UC Treatments / Results  Labs (all labs ordered are listed, but only abnormal results are displayed) Labs Reviewed - No data to display  EKG   Radiology No results found.  Procedures Procedures (including critical care time)  Medications Ordered in UC Medications - No data to display  Initial Impression / Assessment and Plan / UC Course  I have reviewed the triage vital signs and the nursing notes.  Pertinent labs & imaging results that were available during my care of the patient were reviewed by me and considered in my medical decision making (see chart for details).     Start Amoxicillin 1 tablet every 12 hours daily x 10 days. Promethazine-DM PRN for cough and congestion RTC as needed. Final Clinical Impressions(s) / UC Diagnoses   Final diagnoses:  Acute non-recurrent pansinusitis   Discharge Instructions   None    ED Prescriptions     Medication Sig Dispense Auth. Provider   amoxicillin (AMOXIL) 875 MG tablet Take 1 tablet (875 mg total) by mouth 2 (two) times daily. 20 tablet Scot Jun, FNP   promethazine-dextromethorphan (PROMETHAZINE-DM) 6.25-15 MG/5ML syrup Take 5 mLs by mouth 4 (four) times daily as needed for cough. 140 mL Scot Jun, FNP      PDMP not reviewed this encounter.   Scot Jun, FNP 01/12/21 2147

## 2021-01-12 NOTE — ED Triage Notes (Addendum)
Mucous in back of throat, cough and sinus congestion x 1 week. Pt states last time he was like this he had a sinus infection.

## 2021-02-12 ENCOUNTER — Other Ambulatory Visit (HOSPITAL_COMMUNITY): Payer: Self-pay | Admitting: Female Pelvic Medicine and Reconstructive Surgery

## 2021-02-12 DIAGNOSIS — R972 Elevated prostate specific antigen [PSA]: Secondary | ICD-10-CM

## 2021-02-25 ENCOUNTER — Ambulatory Visit (HOSPITAL_COMMUNITY): Payer: 59

## 2021-02-28 ENCOUNTER — Ambulatory Visit (HOSPITAL_COMMUNITY)
Admission: RE | Admit: 2021-02-28 | Discharge: 2021-02-28 | Disposition: A | Discharge status: Home / Self Care | Payer: No Typology Code available for payment source | Source: Ambulatory Visit | Attending: Female Pelvic Medicine and Reconstructive Surgery | Admitting: Female Pelvic Medicine and Reconstructive Surgery

## 2021-02-28 ENCOUNTER — Other Ambulatory Visit: Payer: Self-pay

## 2021-02-28 DIAGNOSIS — R972 Elevated prostate specific antigen [PSA]: Secondary | ICD-10-CM | POA: Insufficient documentation

## 2021-02-28 MED ORDER — GADOBUTROL 1 MMOL/ML IV SOLN
10.0000 mL | Freq: Once | INTRAVENOUS | Status: AC | PRN
  Administered 2021-02-28: 10 mL via INTRAVENOUS

## 2021-08-03 ENCOUNTER — Other Ambulatory Visit: Payer: Self-pay

## 2021-08-03 ENCOUNTER — Encounter: Payer: Self-pay | Admitting: Emergency Medicine

## 2021-08-03 ENCOUNTER — Ambulatory Visit
Admission: EM | Admit: 2021-08-03 | Discharge: 2021-08-03 | Disposition: A | Discharge status: Home / Self Care | Payer: 59 | Attending: Urgent Care | Admitting: Urgent Care

## 2021-08-03 DIAGNOSIS — J069 Acute upper respiratory infection, unspecified: Secondary | ICD-10-CM | POA: Diagnosis not present

## 2021-08-03 DIAGNOSIS — R052 Subacute cough: Secondary | ICD-10-CM

## 2021-08-03 DIAGNOSIS — R070 Pain in throat: Secondary | ICD-10-CM

## 2021-08-03 DIAGNOSIS — I10 Essential (primary) hypertension: Secondary | ICD-10-CM

## 2021-08-03 DIAGNOSIS — J3489 Other specified disorders of nose and nasal sinuses: Secondary | ICD-10-CM

## 2021-08-03 DIAGNOSIS — E119 Type 2 diabetes mellitus without complications: Secondary | ICD-10-CM

## 2021-08-03 MED ORDER — IPRATROPIUM BROMIDE 0.03 % NA SOLN: 2.0000 | Freq: Two times a day (BID) | NASAL | 0 refills | Status: DC

## 2021-08-03 MED ORDER — CETIRIZINE HCL 10 MG PO TABS: 10.0000 mg | ORAL_TABLET | Freq: Every day | ORAL | 0 refills | Status: DC

## 2021-08-03 MED ORDER — AMOXICILLIN 500 MG PO CAPS: 500.0000 mg | ORAL_CAPSULE | Freq: Three times a day (TID) | ORAL | 0 refills | Status: DC

## 2021-08-03 MED ORDER — BENZONATATE 100 MG PO CAPS: 100.0000 mg | ORAL_CAPSULE | Freq: Three times a day (TID) | ORAL | 0 refills | Status: DC | PRN

## 2021-08-03 MED ORDER — PROMETHAZINE-DM 6.25-15 MG/5ML PO SYRP: 5.0000 mL | ORAL_SOLUTION | Freq: Every evening | ORAL | 0 refills | Status: DC | PRN

## 2021-08-03 MED ORDER — PSEUDOEPHEDRINE HCL 60 MG PO TABS: 60.0000 mg | ORAL_TABLET | Freq: Three times a day (TID) | ORAL | 0 refills | Status: DC | PRN

## 2021-08-03 NOTE — ED Provider Notes (Signed)
[facility]   MRN: [id number] DOB: 27-Jul-1960  Subjective:   William Cervantes is a 61 y.o. male presenting for 5-day history of acute onset recurrent sinus pressure, facial pain about the nose, postnasal drainage, throat pain, coughing, left ear discomfort.  Has used over-the-counter Mucinex and cough and cold medications.  Reports that he gets these kinds of problems every year twice a year.  Would like an antibiotic.  Has a history of type 2 diabetes, treated without insulin, last a1c was 8.1% this past week.  No chest pain, shortness of breath or wheezing.  Patient is not a smoker.  No history of asthma.  No current facility-administered medications for this encounter.  Current Outpatient Medications:    amoxicillin (AMOXIL) 875 MG tablet, Take 1 tablet (875 mg total) by mouth 2 (two) times daily., Disp: 20 tablet, Rfl: 0   aspirin EC 81 MG tablet, Take 81 mg by mouth daily., Disp: , Rfl:    atorvastatin (LIPITOR) 20 MG tablet, Take 1 tablet by mouth daily., Disp: , Rfl:    diclofenac (VOLTAREN) 75 MG EC tablet, Take 75 mg by mouth 2 (two) times daily as needed., Disp: , Rfl:    Ergocalciferol (VITAMIN D2) 10 MCG (400 UNIT) TABS, Take by mouth daily., Disp: , Rfl:    glipiZIDE (GLUCOTROL) 10 MG tablet, Take 10 mg by mouth 2 (two) times daily before a meal., Disp: , Rfl:    hydrochlorothiazide (HYDRODIURIL) 25 MG tablet, Take 0.5 tablets by mouth daily., Disp: , Rfl:    losartan (COZAAR) 25 MG tablet, Take 1 tablet by mouth daily., Disp: , Rfl:    metFORMIN (GLUCOPHAGE) 1000 MG tablet, Take 1 tablet by mouth 2 (two) times daily., Disp: , Rfl:    Multiple Vitamins-Minerals (MULTIVITAMIN ADULTS 50+ PO), Take by mouth daily., Disp: , Rfl:    Omega-3 Fatty Acids (FISH OIL ULTRA) 1400 MG CAPS, Take by mouth daily., Disp: , Rfl:    promethazine-dextromethorphan (PROMETHAZINE-DM) 6.25-15 MG/5ML syrup, Take 5 mLs by mouth 4 (four) times daily as needed for cough., Disp: 140 mL, Rfl:  0   tamsulosin (FLOMAX) 0.4 MG CAPS capsule, Take 1 capsule by mouth daily., Disp: , Rfl:    TRAVATAN Z 0.004 % SOLN ophthalmic solution, Place 1 drop into both eyes at bedtime., Disp: , Rfl:    Allergies  Allergen Reactions   Sulfa Antibiotics Swelling    Past Medical History:  Diagnosis Date   Diabetes mellitus ([place])    Glaucoma    Hyperlipidemia    Hypertension    Vitamin D deficiency      Past Surgical History:  Procedure Laterality Date   APPENDECTOMY      Family History  Problem Relation Age of Onset   Diabetes Mother    Hypertension Father    Heart attack Father        passed away at 85 with massive heart attack   Colon cancer Neg Hx    Liver disease Neg Hx     Social History   Tobacco Use   Smoking status: Never   Smokeless tobacco: Never  Substance Use Topics   Alcohol use: Yes    Comment: occ beer, never had heavy use.    Drug use: Never    ROS   Objective:   Vitals: BP 120/84 (BP Location: Right Arm)    Pulse 78    Temp 98.7 F (37.1 C) (Oral)    Resp 18    SpO2 94%  Physical Exam Constitutional:      General: He is not in acute distress.    Appearance: Normal appearance. He is well-developed and normal weight. He is not ill-appearing, toxic-appearing or diaphoretic.  HENT:     Head: Normocephalic and atraumatic.     Right Ear: Tympanic membrane, ear canal and external ear normal. There is no impacted cerumen.     Left Ear: Tympanic membrane, ear canal and external ear normal. There is no impacted cerumen.     Nose: Congestion present. No rhinorrhea.     Comments: Nasal mucosa is boggy and edematous.     Mouth/Throat:     Mouth: Mucous membranes are moist.     Pharynx: No oropharyngeal exudate or posterior oropharyngeal erythema.  Eyes:     General: No scleral icterus.       Right eye: No discharge.        Left eye: No discharge.     Extraocular Movements: Extraocular movements intact.     Conjunctiva/sclera: Conjunctivae normal.   Cardiovascular:     Rate and Rhythm: Normal rate and regular rhythm.     Heart sounds: Normal heart sounds. No murmur heard.   No friction rub. No gallop.  Pulmonary:     Effort: Pulmonary effort is normal. No respiratory distress.     Breath sounds: Normal breath sounds. No stridor. No wheezing, rhonchi or rales.  Musculoskeletal:     Cervical back: Normal range of motion and neck supple. No rigidity. No muscular tenderness.  Neurological:     General: No focal deficit present.     Mental Status: He is alert and oriented to person, place, and time.  Psychiatric:        Mood and Affect: Mood normal.        Behavior: Behavior normal.        Thought Content: Thought content normal.        Judgment: Judgment normal.    Assessment and Plan :   PDMP not reviewed this encounter.  1. Viral upper respiratory illness   2. Sinus pressure   3. Subacute cough   4. Throat pain   5. Essential hypertension   6. Type 2 diabetes mellitus treated without insulin ([place])     Deferred imaging given clear cardiopulmonary exam, hemodynamically stable vital signs.  Patient declined COVID testing as he gets multiple test weekly and all have been negative.  Discussed antibiotic stewardship and for now we will hold off on an antibiotic.  However, patient insisted and therefore I was agreeable to printing a prescription with a specific window for him to use it should his symptoms fail to improve in the next 5 days.  Use supportive care otherwise for what is most likely a viral upper respiratory infection. Counseled patient on potential for adverse effects with medications prescribed/recommended today, ER and return-to-clinic precautions discussed, patient verbalized understanding.    Jaynee Eagles, [place] 08/03/21 6468

## 2021-08-03 NOTE — ED Triage Notes (Signed)
Facial pain and pressure, post nasal drainage, cough, sore throat and ear discomfort x 5 days.  Over the counter products have not been helping.

## 2021-11-07 ENCOUNTER — Other Ambulatory Visit (HOSPITAL_COMMUNITY): Payer: Self-pay | Admitting: Hematology and Oncology

## 2021-11-07 DIAGNOSIS — C61 Malignant neoplasm of prostate: Secondary | ICD-10-CM

## 2021-11-21 ENCOUNTER — Encounter (HOSPITAL_COMMUNITY)
Admission: RE | Admit: 2021-11-21 | Discharge: 2021-11-21 | Disposition: A | Discharge status: Home / Self Care | Payer: No Typology Code available for payment source | Source: Ambulatory Visit | Attending: Hematology and Oncology | Admitting: Hematology and Oncology

## 2021-11-21 DIAGNOSIS — C61 Malignant neoplasm of prostate: Secondary | ICD-10-CM | POA: Diagnosis not present

## 2021-11-21 MED ORDER — PIFLIFOLASTAT F 18 (PYLARIFY) INJECTION
9.0000 | Freq: Once | INTRAVENOUS | Status: AC
  Administered 2021-11-21: 9 via INTRAVENOUS

## 2022-01-09 ENCOUNTER — Encounter: Payer: Self-pay | Admitting: Urology

## 2022-01-09 ENCOUNTER — Ambulatory Visit (INDEPENDENT_AMBULATORY_CARE_PROVIDER_SITE_OTHER): Payer: No Typology Code available for payment source | Admitting: Urology

## 2022-01-09 VITALS — BP 137/85 | HR 76

## 2022-01-09 DIAGNOSIS — C61 Malignant neoplasm of prostate: Secondary | ICD-10-CM

## 2022-01-09 LAB — URINALYSIS, ROUTINE W REFLEX MICROSCOPIC
Bilirubin, UA: NEGATIVE
Ketones, UA: NEGATIVE
Leukocytes,UA: NEGATIVE
Nitrite, UA: NEGATIVE
Protein,UA: NEGATIVE
RBC, UA: NEGATIVE
Specific Gravity, UA: 1.01 (ref 1.005–1.030)
Urobilinogen, Ur: 1 mg/dL (ref 0.2–1.0)
pH, UA: 5.5 (ref 5.0–7.5)

## 2022-01-09 NOTE — Progress Notes (Signed)
01/09/2022 12:28 PM   William Cervantes September 17, 1960 [id number]  Referring provider: Renee Rival, [address],  [place] [address]  Prostate cancer   HPI: William Cervantes is a 61yo here for evaluation of prostate cancer. He was diagnosed with Gleason 9 prostate cancer with PSA 12 at the [place] 6 weeks ago. He underwent PSMA PET which showed no evidence of metastatic disease. IPSS 1 QOL 0. He denies any erectile dysfunction. He is interested in prostatectomy.    PMH: Past Medical History:  Diagnosis Date   Diabetes mellitus ([place])    Glaucoma    Hyperlipidemia    Hypertension    Vitamin D deficiency     Surgical History: Past Surgical History:  Procedure Laterality Date   APPENDECTOMY      Home Medications:  Allergies as of 01/09/2022       Reactions   Sulfa Antibiotics Swelling        Medication List        Accurate as of January 09, 2022 12:28 PM. If you have any questions, ask your nurse or doctor.          amoxicillin 500 MG capsule Commonly known as: AMOXIL Take 1 capsule (500 mg total) by mouth 3 (three) times daily.   aspirin EC 81 MG tablet Take 81 mg by mouth daily.   atorvastatin 20 MG tablet Commonly known as: LIPITOR Take 1 tablet by mouth daily.   benzonatate 100 MG capsule Commonly known as: TESSALON Take 1-2 capsules (100-200 mg total) by mouth 3 (three) times daily as needed for cough.   cetirizine 10 MG tablet Commonly known as: ZyrTEC Allergy Take 1 tablet (10 mg total) by mouth daily.   diclofenac 75 MG EC tablet Commonly known as: VOLTAREN Take 75 mg by mouth 2 (two) times daily as needed.   Fish Oil Ultra 1400 MG Caps Take by mouth daily.   glipiZIDE 10 MG tablet Commonly known as: GLUCOTROL Take 10 mg by mouth 2 (two) times daily before a meal.   hydrochlorothiazide 25 MG tablet Commonly known as: HYDRODIURIL Take 0.5 tablets by mouth daily.   ipratropium 0.03 % nasal spray Commonly  known as: ATROVENT Place 2 sprays into both nostrils 2 (two) times daily.   losartan 25 MG tablet Commonly known as: COZAAR Take 1 tablet by mouth daily.   metFORMIN 1000 MG tablet Commonly known as: GLUCOPHAGE Take 1 tablet by mouth 2 (two) times daily.   MULTIVITAMIN ADULTS 50+ PO Take by mouth daily.   promethazine-dextromethorphan 6.25-15 MG/5ML syrup Commonly known as: PROMETHAZINE-DM Take 5 mLs by mouth at bedtime as needed for cough.   pseudoephedrine 60 MG tablet Commonly known as: SUDAFED Take 1 tablet (60 mg total) by mouth every 8 (eight) hours as needed for congestion.   tamsulosin 0.4 MG Caps capsule Commonly known as: FLOMAX Take 1 capsule by mouth daily.   Travatan Z 0.004 % Soln ophthalmic solution Generic drug: Travoprost (BAK Free) Place 1 drop into both eyes at bedtime.   Vitamin D2 10 MCG (400 UNIT) Tabs Take by mouth daily.        Allergies:  Allergies  Allergen Reactions   Sulfa Antibiotics Swelling    Family History: Family History  Problem Relation Age of Onset   Diabetes Mother    Hypertension Father    Heart attack Father        passed away at 68 with massive heart attack   Colon  cancer Neg Hx    Liver disease Neg Hx     Social History:  reports that he has never smoked. He has never used smokeless tobacco. He reports current alcohol use. He reports that he does not use drugs.  ROS: All other review of systems were reviewed and are negative except what is noted above in HPI  Physical Exam: BP 137/85   Pulse 76   Constitutional:  Alert and oriented, No acute distress. HEENT: [place] AT, moist mucus membranes.  Trachea midline, no masses. Cardiovascular: No clubbing, cyanosis, or edema. Respiratory: Normal respiratory effort, no increased work of breathing. GI: Abdomen is soft, nontender, nondistended, no abdominal masses GU: No CVA tenderness.  Lymph: No cervical or inguinal lymphadenopathy. Skin: No rashes, bruises or  suspicious lesions. Neurologic: Grossly intact, no focal deficits, moving all 4 extremities. Psychiatric: Normal mood and affect.  Laboratory Data: Lab Results  Component Value Date   WBC 6.4 05/10/2019   HGB 14.8 05/10/2019   HCT 42.6 05/10/2019   MCV 91.2 05/10/2019   PLT 180 05/10/2019    No results found for: "CREATININE"  No results found for: "PSA"  No results found for: "TESTOSTERONE"  No results found for: "HGBA1C"  Urinalysis No results found for: "COLORURINE", "APPEARANCEUR", "LABSPEC", "PHURINE", "GLUCOSEU", "HGBUR", "BILIRUBINUR", "KETONESUR", "PROTEINUR", "UROBILINOGEN", "NITRITE", "LEUKOCYTESUR"  No results found for: "LABMICR", "WBCUA", "RBCUA", "LABEPIT", "MUCUS", "BACTERIA"  Pertinent Imaging: PSMA PET 11/21/2021: Images reviewed and discussed with the patient  No results found for this or any previous visit.  No results found for this or any previous visit.  No results found for this or any previous visit.  No results found for this or any previous visit.  No results found for this or any previous visit.  No results found for this or any previous visit.  No results found for this or any previous visit.  No results found for this or any previous visit.   Assessment & Plan:    1. Prostate cancer [facility]) I discussed the natural history of high risk prostate cancer with the patient and the various treatment options including active surveillance, RALP, IMRT, brachytherapy, cryotherapy, HIFU and ADT. After discussing the options the patient elects for Radical prostatectomy. I will refer him to Dr. Tresa Moore for consideration of prostatectomy.   - Urinalysis, Routine w reflex microscopic   No follow-ups on file.  Nicolette Bang, MD  [facility] [place]

## 2022-01-09 NOTE — Patient Instructions (Signed)

## 2022-02-10 ENCOUNTER — Other Ambulatory Visit: Payer: Self-pay | Admitting: Urology

## 2022-02-11 ENCOUNTER — Ambulatory Visit: Payer: 59 | Admitting: Urology

## 2022-04-30 NOTE — Progress Notes (Addendum)
COVID Vaccine Completed: yes  Date of COVID positive in last 90 days: no  PCP - Vena Rua, FNP, pt reports [facility] in [place]  Cardiologist - n/a  Chest x-ray - n/a EKG - 05/01/22 Epic/chart Stress Test - yes, years ago per pt ECHO - n/a Cardiac Cath - n/a Pacemaker/ICD device last checked: n/a Spinal Cord Stimulator: n/a  Bowel Prep - clear liquids day before and magnesium citrate   Sleep Study - no, pt doing home test this weekend CPAP -   Fasting Blood Sugar - 120-194 Checks Blood Sugar  3 times a week  Last dose of GLP1 agonist-  N/A GLP1 instructions:  N/A   Last dose of SGLT-2 inhibitors-  N/A SGLT-2 instructions: N/A   Blood Thinner Instructions: n/a Aspirin Instructions: Last Dose:  Activity level: Can go up a flight of stairs and perform activities of daily living without stopping and without symptoms of chest pain or shortness of breath.   Anesthesia review: STOPBANG 5, HTN, DM2  Patient denies shortness of breath, fever, cough and chest pain at PAT appointment  Patient verbalized understanding of instructions that were given to them at the PAT appointment. Patient was also instructed that they will need to review over the PAT instructions again at home before surgery.

## 2022-05-01 ENCOUNTER — Encounter (HOSPITAL_COMMUNITY): Payer: Self-pay

## 2022-05-01 ENCOUNTER — Encounter (HOSPITAL_COMMUNITY)
Admission: RE | Admit: 2022-05-01 | Discharge: 2022-05-01 | Disposition: A | Discharge status: Home / Self Care | Payer: No Typology Code available for payment source | Source: Ambulatory Visit | Attending: Urology | Admitting: Urology

## 2022-05-01 VITALS — BP 148/93 | HR 83 | Temp 98.4°F | Resp 16 | Ht 69.0 in | Wt 246.2 lb

## 2022-05-01 DIAGNOSIS — Z01818 Encounter for other preprocedural examination: Secondary | ICD-10-CM | POA: Insufficient documentation

## 2022-05-01 DIAGNOSIS — E119 Type 2 diabetes mellitus without complications: Secondary | ICD-10-CM | POA: Diagnosis not present

## 2022-05-01 HISTORY — DX: Unspecified osteoarthritis, unspecified site: M19.90

## 2022-05-01 HISTORY — DX: Malignant (primary) neoplasm, unspecified: C80.1

## 2022-05-01 HISTORY — DX: Personal history of urinary calculi: Z87.442

## 2022-05-01 LAB — CBC
HCT: 45.3 % (ref 39.0–52.0)
Hemoglobin: 15.8 g/dL (ref 13.0–17.0)
MCH: 31.7 pg (ref 26.0–34.0)
MCHC: 34.9 g/dL (ref 30.0–36.0)
MCV: 91 fL (ref 80.0–100.0)
Platelets: 202 10*3/uL (ref 150–400)
RBC: 4.98 MIL/uL (ref 4.22–5.81)
RDW: 12.8 % (ref 11.5–15.5)
WBC: 8.7 10*3/uL (ref 4.0–10.5)
nRBC: 0 % (ref 0.0–0.2)

## 2022-05-01 LAB — BASIC METABOLIC PANEL
Anion gap: 12 (ref 5–15)
BUN: 15 mg/dL (ref 8–23)
CO2: 24 mmol/L (ref 22–32)
Calcium: 9.2 mg/dL (ref 8.9–10.3)
Chloride: 104 mmol/L (ref 98–111)
Creatinine, Ser: 0.92 mg/dL (ref 0.61–1.24)
GFR, Estimated: >60 mL/min (ref >60)
Glucose, Bld: 171 mg/dL — ABNORMAL HIGH (ref 70–99)
Potassium: 3.8 mmol/L (ref 3.5–5.1)
Sodium: 140 mmol/L (ref 135–145)

## 2022-05-01 LAB — HEMOGLOBIN A1C
Hgb A1c MFr Bld: 7.3 % — ABNORMAL HIGH (ref 4.8–5.6)
Mean Plasma Glucose: 162.81 mg/dL

## 2022-05-01 LAB — GLUCOSE, CAPILLARY: Glucose-Capillary: 193 mg/dL — ABNORMAL HIGH (ref 70–99)

## 2022-05-01 NOTE — Patient Instructions (Addendum)
SURGICAL WAITING ROOM VISITATION Patients having surgery or a procedure may have no more than 2 support people in the waiting area - these visitors may rotate.   Children under the age of 24 must have an adult with them who is not the patient. If the patient needs to stay at the hospital during part of their recovery, the visitor guidelines for inpatient rooms apply. Pre-op nurse will coordinate an appropriate time for 1 support person to accompany patient in pre-op.  This support person may not rotate.    Please refer to the [facility] website for the visitor guidelines for Inpatients (after your surgery is over and you are in a regular room).    Your procedure is scheduled on: 05/08/22   Report to [facility] Main Entrance    Report to admitting at 9:00 AM   Call this number if you have problems the morning of surgery [phone]   Follow a clear liquid diet the day before surgery.   After Midnight you may have the following liquids until 8:00 AM DAY OF SURGERY  Water Non-Citrus Juices (without pulp, NO RED) Carbonated Beverages Black Coffee (NO MILK/CREAM OR CREAMERS, sugar ok)  Clear Tea (NO MILK/CREAM OR CREAMERS, sugar ok) regular and decaf                             Plain Jell-O (NO RED)                                           Fruit ices (not with fruit pulp, NO RED)                                     Popsicles (NO RED)                                                               Sports drinks like Gatorade (NO RED)                     If you have questions, please contact your surgeon's office.   FOLLOW BOWEL PREP AND ANY ADDITIONAL PRE OP INSTRUCTIONS YOU RECEIVED FROM YOUR SURGEON'S OFFICE!!!     Oral Hygiene is also important to reduce your risk of infection.                                    Remember - BRUSH YOUR TEETH THE MORNING OF SURGERY WITH YOUR REGULAR TOOTHPASTE   Take these medicines the morning of surgery with A SIP OF WATER: None   DO  NOT TAKE ANY ORAL DIABETIC MEDICATIONS DAY OF YOUR SURGERY  How to Manage Your Diabetes Before and After Surgery  Why is it important to control my blood sugar before and after surgery? Improving blood sugar levels before and after surgery helps healing and can limit problems. A way of improving blood sugar control is eating a healthy diet by:  Eating less sugar and carbohydrates  Increasing activity/exercise  Talking with your doctor about reaching your blood sugar goals High blood sugars (greater than 180 mg/dL) can raise your risk of infections and slow your recovery, so you will need to focus on controlling your diabetes during the weeks before surgery. Make sure that the doctor who takes care of your diabetes knows about your planned surgery including the date and location.  How do I manage my blood sugar before surgery? Check your blood sugar at least 4 times a day, starting 2 days before surgery, to make sure that the level is not too high or low. Check your blood sugar the morning of your surgery when you wake up and every 2 hours until you get to the Short Stay unit. If your blood sugar is less than 70 mg/dL, you will need to treat for low blood sugar: Do not take insulin. Treat a low blood sugar (less than 70 mg/dL) with  cup of clear juice (cranberry or apple), 4 glucose tablets, OR glucose gel. Recheck blood sugar in 15 minutes after treatment (to make sure it is greater than 70 mg/dL). If your blood sugar is not greater than 70 mg/dL on recheck, call [phone] for further instructions. Report your blood sugar to the short stay nurse when you get to Short Stay.  If you are admitted to the hospital after surgery: Your blood sugar will be checked by the staff and you will probably be given insulin after surgery (instead of oral diabetes medicines) to make sure you have good blood sugar levels. The goal for blood sugar control after surgery is 80-180 mg/dL.   WHAT DO I DO  ABOUT MY DIABETES MEDICATION?  Do not take oral diabetes medicines (pills) the morning of surgery.  Do not take Jardiance for 3 days before surgery. Last dose Monday 05/04/22.  THE DAY BEFORE SURGERY, take Metformin as prescribed. Take only morning dose of Glipizide, no afternoon or evening dose.     THE MORNING OF SURGERY, do not take Metformin or Glipizide   Reviewed and Endorsed by [facility] Patient Education Committee, August 2015                              You may not have any metal on your body including jewelry, and body piercing             Do not wear lotions, powders, cologne, or deodorant              Men may shave face and neck.   Do not bring valuables to the hospital. [place].   Bring small overnight bag day of surgery.   DO NOT [place]. PHARMACY WILL DISPENSE MEDICATIONS LISTED ON YOUR MEDICATION LIST TO YOU DURING YOUR ADMISSION [place]!   Special Instructions: Bring a copy of your healthcare power of attorney and living will documents the day of surgery if you haven't scanned them before.              Please read over the following fact sheets you were given: IF [place] [phone] Schneiders   If you received a COVID test during your pre-op visit  it is requested that you wear a mask when out in public, stay away from anyone  that may not be feeling well and notify your surgeon if you develop symptoms. If you test positive for Covid or have been in contact with anyone that has tested positive in the last 10 days please notify you surgeon.    [place] - Preparing for Surgery Before surgery, you can play an important role.  Because skin is not sterile, your skin needs to be as free of germs as possible.  You can reduce the number of germs on your skin by washing with CHG (chlorahexidine gluconate) soap before surgery.   CHG is an antiseptic cleaner which kills germs and bonds with the skin to continue killing germs even after washing. Please DO NOT use if you have an allergy to CHG or antibacterial soaps.  If your skin becomes reddened/irritated stop using the CHG and inform your nurse when you arrive at Short Stay. Do not shave (including legs and underarms) for at least 48 hours prior to the first CHG shower.  You may shave your face/neck.  Please follow these instructions carefully:  1.  Shower with CHG Soap the night before surgery and the  morning of surgery.  2.  If you choose to wash your hair, wash your hair first as usual with your normal  shampoo.  3.  After you shampoo, rinse your hair and body thoroughly to remove the shampoo.                             4.  Use CHG as you would any other liquid soap.  You can apply chg directly to the skin and wash.  Gently with a scrungie or clean washcloth.  5.  Apply the CHG Soap to your body ONLY FROM THE NECK DOWN.   Do   not use on face/ open                           Wound or open sores. Avoid contact with eyes, ears mouth and   genitals (private parts).                       Wash face,  Genitals (private parts) with your normal soap.             6.  Wash thoroughly, paying special attention to the area where your    surgery  will be performed.  7.  Thoroughly rinse your body with warm water from the neck down.  8.  DO NOT shower/wash with your normal soap after using and rinsing off the CHG Soap.                9.  Pat yourself dry with a clean towel.            10.  Wear clean pajamas.            11.  Place clean sheets on your bed the night of your first shower and do not  sleep with pets. Day of Surgery : Do not apply any lotions/deodorants the morning of surgery.  Please wear clean clothes to the hospital/surgery center.  FAILURE TO FOLLOW THESE INSTRUCTIONS MAY RESULT IN THE CANCELLATION OF YOUR SURGERY  PATIENT  SIGNATURE_________________________________  NURSE SIGNATURE__________________________________  ________________________________________________________________________

## 2022-05-01 NOTE — Progress Notes (Signed)
   05/01/22 1358  OBSTRUCTIVE SLEEP APNEA  Have you ever been diagnosed with sleep apnea through a sleep study? No  Do you snore loudly (loud enough to be heard through closed doors)?  1  Do you often feel tired, fatigued, or sleepy during the daytime (such as falling asleep during driving or talking to someone)? 0  Has anyone observed you stop breathing during your sleep? 0  Do you have, or are you being treated for high blood pressure? 1  BMI more than 35 kg/m2? 1  Age > 50 (1-yes) 1  Neck circumference greater than:Male 16 inches or larger, Male 17inches or larger? 0  Male Gender (Yes=1) 1  Obstructive Sleep Apnea Score 5

## 2022-05-08 ENCOUNTER — Encounter (HOSPITAL_COMMUNITY): Payer: Self-pay | Admitting: Urology

## 2022-05-08 ENCOUNTER — Ambulatory Visit (HOSPITAL_COMMUNITY): Payer: No Typology Code available for payment source | Admitting: Physician Assistant

## 2022-05-08 ENCOUNTER — Other Ambulatory Visit: Payer: Self-pay

## 2022-05-08 ENCOUNTER — Encounter (HOSPITAL_COMMUNITY)
Admission: RE | Disposition: A | Discharge status: Home / Self Care | Payer: Self-pay | Source: Ambulatory Visit | Attending: Urology

## 2022-05-08 ENCOUNTER — Ambulatory Visit (HOSPITAL_BASED_OUTPATIENT_CLINIC_OR_DEPARTMENT_OTHER): Payer: No Typology Code available for payment source | Admitting: Anesthesiology

## 2022-05-08 ENCOUNTER — Observation Stay (HOSPITAL_COMMUNITY)
Admission: RE | Admit: 2022-05-08 | Discharge: 2022-05-09 | Disposition: A | Discharge status: Home / Self Care | Payer: No Typology Code available for payment source | Source: Ambulatory Visit | Attending: Urology | Admitting: Urology

## 2022-05-08 DIAGNOSIS — E119 Type 2 diabetes mellitus without complications: Secondary | ICD-10-CM | POA: Insufficient documentation

## 2022-05-08 DIAGNOSIS — C61 Malignant neoplasm of prostate: Secondary | ICD-10-CM

## 2022-05-08 DIAGNOSIS — I10 Essential (primary) hypertension: Secondary | ICD-10-CM | POA: Diagnosis not present

## 2022-05-08 HISTORY — PX: ROBOT ASSISTED LAPAROSCOPIC RADICAL PROSTATECTOMY: SHX5141

## 2022-05-08 HISTORY — PX: LYMPH NODE DISSECTION: SHX5087

## 2022-05-08 LAB — GLUCOSE, CAPILLARY
Glucose-Capillary: 152 mg/dL — ABNORMAL HIGH (ref 70–99)
Glucose-Capillary: 176 mg/dL — ABNORMAL HIGH (ref 70–99)
Glucose-Capillary: 189 mg/dL — ABNORMAL HIGH (ref 70–99)

## 2022-05-08 LAB — TYPE AND SCREEN
ABO/RH(D): O POS
Antibody Screen: NEGATIVE

## 2022-05-08 LAB — ABO/RH: ABO/RH(D): O POS

## 2022-05-08 LAB — HEMOGLOBIN AND HEMATOCRIT, BLOOD
HCT: 41.6 % (ref 39.0–52.0)
Hemoglobin: 14.3 g/dL (ref 13.0–17.0)

## 2022-05-08 SURGERY — PROSTATECTOMY, RADICAL, ROBOT-ASSISTED, LAPAROSCOPIC
Anesthesia: General | Site: Abdomen

## 2022-05-08 MED ORDER — PHENYLEPHRINE HCL-NACL 20-0.9 MG/250ML-% IV SOLN
INTRAVENOUS | Status: DC | PRN
  Administered 2022-05-08: 50 ug/min via INTRAVENOUS

## 2022-05-08 MED ORDER — ACETAMINOPHEN 325 MG PO TABS: 325.0000 mg | ORAL_TABLET | Freq: Once | ORAL | Status: DC | PRN

## 2022-05-08 MED ORDER — PROPOFOL 10 MG/ML IV BOLUS
INTRAVENOUS | Status: DC | PRN
  Administered 2022-05-08: 150 mg via INTRAVENOUS

## 2022-05-08 MED ORDER — TRIPLE ANTIBIOTIC 3.5-400-5000 EX OINT: 1.0000 | TOPICAL_OINTMENT | Freq: Three times a day (TID) | CUTANEOUS | Status: DC | PRN

## 2022-05-08 MED ORDER — MAGNESIUM CITRATE PO SOLN
1.0000 | Freq: Once | ORAL | Status: DC
  Filled 2022-05-08: qty 296

## 2022-05-08 MED ORDER — LIDOCAINE 2% (20 MG/ML) 5 ML SYRINGE
INTRAMUSCULAR | Status: DC | PRN
  Administered 2022-05-08: 40 mg via INTRAVENOUS

## 2022-05-08 MED ORDER — HYOSCYAMINE SULFATE 0.125 MG SL SUBL: 0.1250 mg | SUBLINGUAL_TABLET | SUBLINGUAL | Status: DC | PRN

## 2022-05-08 MED ORDER — ALBUMIN HUMAN 5 % IV SOLN: INTRAVENOUS | Status: DC | PRN

## 2022-05-08 MED ORDER — DIPHENHYDRAMINE HCL 50 MG/ML IJ SOLN: 12.5000 mg | Freq: Four times a day (QID) | INTRAMUSCULAR | Status: DC | PRN

## 2022-05-08 MED ORDER — FENTANYL CITRATE (PF) 100 MCG/2ML IJ SOLN
INTRAMUSCULAR | Status: AC
  Filled 2022-05-08: qty 2

## 2022-05-08 MED ORDER — HYDROMORPHONE HCL 1 MG/ML IJ SOLN: 0.2500 mg | INTRAMUSCULAR | Status: DC | PRN

## 2022-05-08 MED ORDER — SODIUM CHLORIDE (PF) 0.9 % IJ SOLN
INTRAMUSCULAR | Status: DC | PRN
  Administered 2022-05-08: 20 mL

## 2022-05-08 MED ORDER — DEXAMETHASONE SODIUM PHOSPHATE 10 MG/ML IJ SOLN
INTRAMUSCULAR | Status: AC
  Filled 2022-05-08: qty 1

## 2022-05-08 MED ORDER — PHENYLEPHRINE 80 MCG/ML (10ML) SYRINGE FOR IV PUSH (FOR BLOOD PRESSURE SUPPORT)
PREFILLED_SYRINGE | INTRAVENOUS | Status: DC | PRN
  Administered 2022-05-08 (×2): 160 ug via INTRAVENOUS

## 2022-05-08 MED ORDER — ONDANSETRON HCL 4 MG/2ML IJ SOLN
INTRAMUSCULAR | Status: AC
  Filled 2022-05-08: qty 2

## 2022-05-08 MED ORDER — CIPROFLOXACIN HCL 500 MG PO TABS: 500.0000 mg | ORAL_TABLET | Freq: Two times a day (BID) | ORAL | 0 refills | Status: DC

## 2022-05-08 MED ORDER — SODIUM CHLORIDE (PF) 0.9 % IJ SOLN
INTRAMUSCULAR | Status: AC
  Filled 2022-05-08: qty 20

## 2022-05-08 MED ORDER — EPHEDRINE SULFATE-NACL 50-0.9 MG/10ML-% IV SOSY
PREFILLED_SYRINGE | INTRAVENOUS | Status: DC | PRN
  Administered 2022-05-08: 10 mg via INTRAVENOUS

## 2022-05-08 MED ORDER — DOCUSATE SODIUM 100 MG PO CAPS
100.0000 mg | ORAL_CAPSULE | Freq: Two times a day (BID) | ORAL | Status: DC
  Administered 2022-05-08 – 2022-05-09 (×2): 100 mg via ORAL
  Filled 2022-05-08 (×2): qty 1

## 2022-05-08 MED ORDER — AMISULPRIDE (ANTIEMETIC) 5 MG/2ML IV SOLN: 10.0000 mg | Freq: Once | INTRAVENOUS | Status: DC | PRN

## 2022-05-08 MED ORDER — MIDAZOLAM HCL 2 MG/2ML IJ SOLN
INTRAMUSCULAR | Status: AC
  Filled 2022-05-08: qty 2

## 2022-05-08 MED ORDER — LACTATED RINGERS IV SOLN: INTRAVENOUS | Status: DC

## 2022-05-08 MED ORDER — PROMETHAZINE HCL 25 MG/ML IJ SOLN: 6.2500 mg | INTRAMUSCULAR | Status: DC | PRN

## 2022-05-08 MED ORDER — ACETAMINOPHEN 160 MG/5ML PO SOLN: 325.0000 mg | Freq: Once | ORAL | Status: DC | PRN

## 2022-05-08 MED ORDER — LATANOPROST 0.005 % OP SOLN
1.0000 [drp] | Freq: Every day | OPHTHALMIC | Status: DC
  Administered 2022-05-08: 1 [drp] via OPHTHALMIC
  Filled 2022-05-08: qty 2.5

## 2022-05-08 MED ORDER — HYDROCODONE-ACETAMINOPHEN 5-325 MG PO TABS: 1.0000 | ORAL_TABLET | Freq: Four times a day (QID) | ORAL | 0 refills | Status: DC | PRN

## 2022-05-08 MED ORDER — CEFAZOLIN SODIUM-DEXTROSE 2-4 GM/100ML-% IV SOLN
2.0000 g | INTRAVENOUS | Status: AC
  Administered 2022-05-08: 2 g via INTRAVENOUS
  Filled 2022-05-08: qty 100

## 2022-05-08 MED ORDER — MEPERIDINE HCL 50 MG/ML IJ SOLN: 6.2500 mg | INTRAMUSCULAR | Status: DC | PRN

## 2022-05-08 MED ORDER — ROCURONIUM BROMIDE 10 MG/ML (PF) SYRINGE
PREFILLED_SYRINGE | INTRAVENOUS | Status: AC
  Filled 2022-05-08: qty 10

## 2022-05-08 MED ORDER — SUGAMMADEX SODIUM 500 MG/5ML IV SOLN
INTRAVENOUS | Status: DC | PRN
  Administered 2022-05-08: 400 mg via INTRAVENOUS

## 2022-05-08 MED ORDER — HYDROMORPHONE HCL 1 MG/ML IJ SOLN: 0.5000 mg | INTRAMUSCULAR | Status: DC | PRN

## 2022-05-08 MED ORDER — ONDANSETRON HCL 4 MG/2ML IJ SOLN: 4.0000 mg | INTRAMUSCULAR | Status: DC | PRN

## 2022-05-08 MED ORDER — CHLORHEXIDINE GLUCONATE 0.12 % MT SOLN
15.0000 mL | Freq: Once | OROMUCOSAL | Status: AC
  Administered 2022-05-08: 15 mL via OROMUCOSAL

## 2022-05-08 MED ORDER — MIDAZOLAM HCL 2 MG/2ML IJ SOLN
INTRAMUSCULAR | Status: DC | PRN
  Administered 2022-05-08: 2 mg via INTRAVENOUS

## 2022-05-08 MED ORDER — DEXAMETHASONE SODIUM PHOSPHATE 10 MG/ML IJ SOLN
INTRAMUSCULAR | Status: DC | PRN
  Administered 2022-05-08: 4 mg via INTRAVENOUS

## 2022-05-08 MED ORDER — INSULIN ASPART 100 UNIT/ML IJ SOLN: 0.0000 [IU] | Freq: Every day | INTRAMUSCULAR | Status: DC

## 2022-05-08 MED ORDER — INSULIN ASPART 100 UNIT/ML IJ SOLN
0.0000 [IU] | Freq: Three times a day (TID) | INTRAMUSCULAR | Status: DC
  Administered 2022-05-09: 3 [IU] via SUBCUTANEOUS
  Administered 2022-05-09: 2 [IU] via SUBCUTANEOUS

## 2022-05-08 MED ORDER — VASOPRESSIN 20 UNIT/ML IV SOLN
INTRAVENOUS | Status: AC
  Filled 2022-05-08: qty 1

## 2022-05-08 MED ORDER — ACETAMINOPHEN 10 MG/ML IV SOLN: 1000.0000 mg | Freq: Once | INTRAVENOUS | Status: DC | PRN

## 2022-05-08 MED ORDER — FENTANYL CITRATE (PF) 250 MCG/5ML IJ SOLN
INTRAMUSCULAR | Status: AC
  Filled 2022-05-08: qty 5

## 2022-05-08 MED ORDER — ROCURONIUM BROMIDE 10 MG/ML (PF) SYRINGE
PREFILLED_SYRINGE | INTRAVENOUS | Status: DC | PRN
  Administered 2022-05-08: 20 mg via INTRAVENOUS
  Administered 2022-05-08: 40 mg via INTRAVENOUS
  Administered 2022-05-08: 60 mg via INTRAVENOUS
  Administered 2022-05-08: 20 mg via INTRAVENOUS

## 2022-05-08 MED ORDER — HYDROCHLOROTHIAZIDE 12.5 MG PO TABS
12.5000 mg | ORAL_TABLET | Freq: Every day | ORAL | Status: DC
  Administered 2022-05-09: 12.5 mg via ORAL
  Filled 2022-05-08: qty 1

## 2022-05-08 MED ORDER — DOCUSATE SODIUM 100 MG PO CAPS: 100.0000 mg | ORAL_CAPSULE | Freq: Two times a day (BID) | ORAL | Status: DC

## 2022-05-08 MED ORDER — ALBUMIN HUMAN 5 % IV SOLN
INTRAVENOUS | Status: AC
  Filled 2022-05-08: qty 250

## 2022-05-08 MED ORDER — BUPIVACAINE LIPOSOME 1.3 % IJ SUSP
INTRAMUSCULAR | Status: AC
  Filled 2022-05-08: qty 20

## 2022-05-08 MED ORDER — DIPHENHYDRAMINE HCL 12.5 MG/5ML PO ELIX: 12.5000 mg | ORAL_SOLUTION | Freq: Four times a day (QID) | ORAL | Status: DC | PRN

## 2022-05-08 MED ORDER — LACTATED RINGERS IR SOLN
Status: DC | PRN
  Administered 2022-05-08: 1000 mL

## 2022-05-08 MED ORDER — LACTATED RINGERS IV SOLN: INTRAVENOUS | Status: DC | PRN

## 2022-05-08 MED ORDER — OXYCODONE HCL 5 MG PO TABS: 5.0000 mg | ORAL_TABLET | ORAL | Status: DC | PRN

## 2022-05-08 MED ORDER — ACETAMINOPHEN 500 MG PO TABS
1000.0000 mg | ORAL_TABLET | Freq: Four times a day (QID) | ORAL | Status: AC
  Administered 2022-05-08 – 2022-05-09 (×4): 1000 mg via ORAL
  Filled 2022-05-08 (×4): qty 2

## 2022-05-08 MED ORDER — BUPIVACAINE LIPOSOME 1.3 % IJ SUSP
INTRAMUSCULAR | Status: DC | PRN
  Administered 2022-05-08: 20 mL

## 2022-05-08 MED ORDER — PHENYLEPHRINE HCL (PRESSORS) 10 MG/ML IV SOLN
INTRAVENOUS | Status: AC
  Filled 2022-05-08: qty 1

## 2022-05-08 MED ORDER — STERILE WATER FOR IRRIGATION IR SOLN
Status: DC | PRN
  Administered 2022-05-08: 1000 mL

## 2022-05-08 MED ORDER — LIDOCAINE HCL (PF) 2 % IJ SOLN
INTRAMUSCULAR | Status: AC
  Filled 2022-05-08: qty 5

## 2022-05-08 MED ORDER — SODIUM CHLORIDE 0.45 % IV SOLN: INTRAVENOUS | Status: DC

## 2022-05-08 MED ORDER — ORAL CARE MOUTH RINSE: 15.0000 mL | Freq: Once | OROMUCOSAL | Status: AC

## 2022-05-08 MED ORDER — PROPOFOL 10 MG/ML IV BOLUS
INTRAVENOUS | Status: AC
  Filled 2022-05-08: qty 20

## 2022-05-08 MED ORDER — PHENYLEPHRINE 80 MCG/ML (10ML) SYRINGE FOR IV PUSH (FOR BLOOD PRESSURE SUPPORT)
PREFILLED_SYRINGE | INTRAVENOUS | Status: AC
  Filled 2022-05-08: qty 10

## 2022-05-08 MED ORDER — ATORVASTATIN CALCIUM 20 MG PO TABS
20.0000 mg | ORAL_TABLET | Freq: Every day | ORAL | Status: DC
  Administered 2022-05-08: 20 mg via ORAL
  Filled 2022-05-08: qty 1

## 2022-05-08 MED ORDER — SUGAMMADEX SODIUM 500 MG/5ML IV SOLN
INTRAVENOUS | Status: AC
  Filled 2022-05-08: qty 5

## 2022-05-08 MED ORDER — VASOPRESSIN 20 UNIT/ML IV SOLN
INTRAVENOUS | Status: DC | PRN
  Administered 2022-05-08: 1 [IU] via INTRAVENOUS
  Administered 2022-05-08: 2 [IU] via INTRAVENOUS

## 2022-05-08 MED ORDER — LOSARTAN POTASSIUM 25 MG PO TABS
25.0000 mg | ORAL_TABLET | Freq: Every day | ORAL | Status: DC
  Administered 2022-05-08 – 2022-05-09 (×2): 25 mg via ORAL
  Filled 2022-05-08 (×2): qty 1

## 2022-05-08 MED ORDER — FENTANYL CITRATE (PF) 250 MCG/5ML IJ SOLN
INTRAMUSCULAR | Status: DC | PRN
  Administered 2022-05-08 (×2): 50 ug via INTRAVENOUS
  Administered 2022-05-08: 75 ug via INTRAVENOUS
  Administered 2022-05-08: 100 ug via INTRAVENOUS
  Administered 2022-05-08: 25 ug via INTRAVENOUS
  Administered 2022-05-08: 50 ug via INTRAVENOUS

## 2022-05-08 MED ORDER — SODIUM CHLORIDE 0.9 % IV BOLUS
1000.0000 mL | Freq: Once | INTRAVENOUS | Status: AC
  Administered 2022-05-08: 1000 mL via INTRAVENOUS

## 2022-05-08 MED ORDER — STERILE WATER FOR INJECTION IJ SOLN
INTRAMUSCULAR | Status: DC | PRN
  Administered 2022-05-08: 10 mL

## 2022-05-08 MED ORDER — ONDANSETRON HCL 4 MG/2ML IJ SOLN
INTRAMUSCULAR | Status: DC | PRN
  Administered 2022-05-08: 4 mg via INTRAVENOUS

## 2022-05-08 SURGICAL SUPPLY — 72 items
APPLICATOR COTTON TIP 6 STRL (MISCELLANEOUS) ×2 IMPLANT
APPLICATOR COTTON TIP 6IN STRL (MISCELLANEOUS) ×2
BAG COUNTER SPONGE SURGICOUNT (BAG) IMPLANT
CATH FOLEY 2WAY SLVR 18FR 30CC (CATHETERS) ×2 IMPLANT
CATH TIEMANN FOLEY 18FR 5CC (CATHETERS) ×2 IMPLANT
CHLORAPREP W/TINT 26 (MISCELLANEOUS) ×2 IMPLANT
CLIP LIGATING HEM O LOK PURPLE (MISCELLANEOUS) ×8 IMPLANT
CNTNR URN SCR LID CUP LEK RST (MISCELLANEOUS) ×2 IMPLANT
CONT SPEC 4OZ STRL OR WHT (MISCELLANEOUS) ×2
COVER SURGICAL LIGHT HANDLE (MISCELLANEOUS) ×2 IMPLANT
COVER TIP SHEARS 8 DVNC (MISCELLANEOUS) ×2 IMPLANT
COVER TIP SHEARS 8MM DA VINCI (MISCELLANEOUS) ×2
CUTTER ECHEON FLEX ENDO 45 340 (ENDOMECHANICALS) ×2 IMPLANT
DERMABOND ADVANCED .7 DNX12 (GAUZE/BANDAGES/DRESSINGS) ×2 IMPLANT
DRAIN CHANNEL RND F F (WOUND CARE) IMPLANT
DRAPE ARM DVNC X/XI (DISPOSABLE) ×8 IMPLANT
DRAPE COLUMN DVNC XI (DISPOSABLE) ×2 IMPLANT
DRAPE DA VINCI XI ARM (DISPOSABLE) ×8
DRAPE DA VINCI XI COLUMN (DISPOSABLE) ×2
DRAPE SURG IRRIG POUCH 19X23 (DRAPES) ×2 IMPLANT
DRSG TEGADERM 4X4.75 (GAUZE/BANDAGES/DRESSINGS) ×2 IMPLANT
ELECT PENCIL ROCKER SW 15FT (MISCELLANEOUS) IMPLANT
ELECT REM PT RETURN 15FT ADLT (MISCELLANEOUS) ×2 IMPLANT
GAUZE 4X4 16PLY ~~LOC~~+RFID DBL (SPONGE) IMPLANT
GAUZE SPONGE 2X2 8PLY STRL LF (GAUZE/BANDAGES/DRESSINGS) IMPLANT
GAUZE SPONGE 4X4 12PLY STRL (GAUZE/BANDAGES/DRESSINGS) ×2 IMPLANT
GLOVE BIO SURGEON STRL SZ 6.5 (GLOVE) ×2 IMPLANT
GLOVE BIOGEL PI IND STRL 7.5 (GLOVE) ×2 IMPLANT
GLOVE SURG LX STRL 7.5 STRW (GLOVE) ×4 IMPLANT
GOWN SRG XL LVL 4 BRTHBL STRL (GOWNS) ×2 IMPLANT
GOWN STRL NON-REIN XL LVL4 (GOWNS) ×2
GOWN STRL REUS W/ TWL XL LVL3 (GOWN DISPOSABLE) ×4 IMPLANT
GOWN STRL REUS W/TWL XL LVL3 (GOWN DISPOSABLE) ×4
HOLDER FOLEY CATH W/STRAP (MISCELLANEOUS) ×2 IMPLANT
IRRIG SUCT STRYKERFLOW 2 WTIP (MISCELLANEOUS) ×2
IRRIGATION SUCT STRKRFLW 2 WTP (MISCELLANEOUS) ×2 IMPLANT
IV LACTATED RINGERS 1000ML (IV SOLUTION) ×2 IMPLANT
KIT PROCEDURE DA VINCI SI (MISCELLANEOUS) ×2
KIT PROCEDURE DVNC SI (MISCELLANEOUS) ×2 IMPLANT
KIT TURNOVER KIT A (KITS) IMPLANT
NDL INSUFFLATION 14GA 120MM (NEEDLE) ×2 IMPLANT
NDL SPNL 22GX7 QUINCKE BK (NEEDLE) ×2 IMPLANT
NEEDLE INSUFFLATION 14GA 120MM (NEEDLE) ×2 IMPLANT
NEEDLE SPNL 22GX7 QUINCKE BK (NEEDLE) ×2 IMPLANT
PACK ROBOT UROLOGY CUSTOM (CUSTOM PROCEDURE TRAY) ×2 IMPLANT
PAD POSITIONING PINK XL (MISCELLANEOUS) ×2 IMPLANT
PORT ACCESS TROCAR AIRSEAL 12 (TROCAR) ×2 IMPLANT
RELOAD STAPLE 45 4.1 GRN THCK (STAPLE) ×2 IMPLANT
SEAL CANN UNIV 5-8 DVNC XI (MISCELLANEOUS) ×8 IMPLANT
SEAL XI 5MM-8MM UNIVERSAL (MISCELLANEOUS) ×8
SET TRI-LUMEN FLTR TB AIRSEAL (TUBING) ×2 IMPLANT
SET TUBE SMOKE EVAC HIGH FLOW (TUBING) IMPLANT
SOLUTION ELECTROLUBE (MISCELLANEOUS) ×2 IMPLANT
SPIKE FLUID TRANSFER (MISCELLANEOUS) ×2 IMPLANT
SPONGE T-LAP 4X18 ~~LOC~~+RFID (SPONGE) ×2 IMPLANT
STAPLE RELOAD 45 GRN (STAPLE) ×2 IMPLANT
STAPLE RELOAD 45MM GREEN (STAPLE) ×2
SUT ETHILON 3 0 PS 1 (SUTURE) ×2 IMPLANT
SUT MNCRL AB 4-0 PS2 18 (SUTURE) ×4 IMPLANT
SUT PDS AB 1 CT1 27 (SUTURE) ×4 IMPLANT
SUT VIC AB 2-0 SH 27 (SUTURE) ×2
SUT VIC AB 2-0 SH 27X BRD (SUTURE) ×2 IMPLANT
SUT VIC AB 3-0 SH 27 (SUTURE) ×2
SUT VIC AB 3-0 SH 27XBRD (SUTURE) IMPLANT
SUT VICRYL 0 UR6 27IN ABS (SUTURE) ×2 IMPLANT
SUT VLOC BARB 180 ABS3/0GR12 (SUTURE) ×12
SUTURE VLOC BRB 180 ABS3/0GR12 (SUTURE) ×6 IMPLANT
SYR 20ML LL LF (SYRINGE) IMPLANT
SYR 27GX1/2 1ML LL SAFETY (SYRINGE) ×2 IMPLANT
TOWEL OR NON WOVEN STRL DISP B (DISPOSABLE) ×2 IMPLANT
TROCAR Z-THREAD FIOS 5X100MM (TROCAR) IMPLANT
WATER STERILE IRR 1000ML POUR (IV SOLUTION) ×2 IMPLANT

## 2022-05-08 NOTE — Op Note (Signed)
NAME: William Cervantes, ESCOE MEDICAL RECORD NO: [id number] ACCOUNT NO: [id number] DATE OF BIRTH: 02-10-1961 FACILITY: [facility] LOCATION: [id number] PHYSICIAN: Alexis Frock, MD  Operative Report   DATE OF PROCEDURE: 05/08/2022  PREOPERATIVE DIAGNOSIS:  High risk prostate cancer.  PROCEDURES PERFORMED:  1.  Robotic-assisted laparoscopic radical prostatectomy. 2.  Bilateral pelvic lymphadenectomy. 3.  Extensive adhesiolysis.  ESTIMATED BLOOD LOSS:  400 mL.  COMPLICATIONS:  None.  SPECIMENS:  1.  Right external iliac lymph nodes. 2.  Right obturator lymph nodes. 3.  Left external iliac lymph nodes. 4.  Left obturator lymph nodes. 5.  Radical prostatectomy.  FINDINGS:  1.  Significant adhesions in the small and large bowel, right lower quadrant consistent with known prior perforated appendicitis. 2.  No obvious sentinel lymph nodes within the pelvis despite excellent parenchymal uptake with ICG dye.  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  He underwent staging imaging with PET scan that was unrevealing of any distant disease.  He  was referred for preservation of curative therapy.  Options were again reiterated including surveillance protocols versus surgical extirpation versus ablative therapies.  The patient wants to proceed with prostatectomy with curative intent.  Informed  consent was obtained and placed in medical record.  PROCEDURE IN DETAIL:  The patient being [name] verified and the procedure being radical prostatectomy was confirmed.  Procedure timeout was performed.  Intravenous antibiotics administered.  General endotracheal anesthesia induced.  The patient  was placed into a low lithotomy position.  Sterile field was created, prepped and draped the patient's penis, perineum, and proximal thighs using iodine and his infra-xiphoid abdomen using chlorhexidine  gluconate after clipper shaving and after he was  further fastened to operating table using 3-inch tape with foam padding across supraxiphoid chest.  His arms were tucked to the side with gel rolls.  Test of steep Trendelenburg positioning was performed.  He was found to be suitably positioned.  Foley  catheter was placed free to straight drain.  Next, a high flow, low pressure, pneumoperitoneum was obtained using Veress technique in the supraumbilical midline having passed the aspiration and drop test.  An 8 mm robotic camera port was then placed in  the location.  Laparoscopic examination of the peritoneal cavity did reveal some dense appearing adhesions of omentum and large bowel and small bowel and right lower quadrant, likely consistent with known prior significant appendicitis.  Additional ports  were placed as follows:  Right paramedian 8 mm robotic port, right far lateral 12 mm AirSeal assist port, right paramedian 5 mm suction port, left paramedian 8 mm robotic port, left far lateral 8 mm robotic port.  Robot was docked and passed the trial  checks.  Initial attention was directed at adhesiolysis.  Very careful adhesiolysis was performed of multiple loops of small bowel, several small bowel interloop adhesions as well as the area of the cecum away from the right lower quadrant, anterior  abdominal wall and iliac vasculature to allow access to the deep pelvis.  Exquisite care was taken to avoid any sort of bowel injury, this did not occur.  Attention was directed at development of space of Retzius.  Incision was made lateral to the right  median umbilical ligament from the midline towards the area of the internal ring coursing along the iliac vessels towards the area of the right ureter, the vas was encountered, ligated  using medial bucket handle and the right bladder wall swept away the  pelvic sidewall towards the area of the endopelvic fascia on the right side.  A mirror image dissection was  performed on the left side.  Anterior attachments were taken down with cautery scissors to expose the anterior base of the prostate, which was  defatted to better denote the bladder neck prostate junction.  Next, 0.2 mL of indocyanine green dye was injected in each lobe of the prostate using a percutaneously placed robotically guided spinal needle for sentinel lymphangiography.  Next, the  endopelvic fascia was swept away from the lateral aspect of the prostate in base to apex orientation.  This exposed the dorsal venous complex which was carefully controlled using green load stapler, taking exquisite care to avoid membranous urethral  injury, which did not occur.  Then, approximately 10 minutes post-dye injection, the pelvis was inspected under near infrared fluorescence light.  Sentinel lymphangiography revealed excellent parenchymal uptake of the prostate and several lymphatic channels mostly on the left side; however, there were no obvious sentinel lymph nodes within the pelvic lymph node fields.  As such, standard template  lymph node was performed first on the right side of the right external iliac group with boundaries being right external iliac artery, vein, pelvic sidewall, iliac bifurcation.  Hemostasis achieved with cold clips, set aside labeled as right external  iliac lymph nodes.  Next, right obturator group was dissected free with the boundaries being right external iliac vein, pelvic sidewall, obturator nerve.  Lymphostasis was again achieved with cold clips set aside labeled as right obturator lymph nodes.   The right obturator nerve was inspected following maneuvers and found to be uninjured.  A mirror image lymphadenectomy was performed on the left side, left external iliac, left obturator groups respectively.  Left obturator nerve was similarly uninjured.   Attention was directed at bladder neck dissection.  A lateral release was performed on each side to better denote the bladder neck,  true caliber and the bladder neck was separated from the base of the prostate in anterior and posterior direction,  keeping what appeared to be a rim of circular muscle fibers with each plane of dissection, thus performing complete bladder neck preservation.  Posterior dissection was performed by incising approximately 7 mm inferior posterior.  The posterior lip of  prostate entering the plane of Denonvilliers.  Bilateral vas deferens were encountered, ligated after dissecting for a distance of approximately 4 cm, placed on gentle superior traction.  Bilateral seminal vesicles were dissected to their tips and also  placed on gentle superior traction.  Dissection then proceeded inferiorly towards the apex of the prostate within the plane of Denonvilliers.  This exposed the vascular pedicles, which were carefully controlled using a sequential clipping technique in a  base to apex orientation, performing a moderately wide dissection given his high risk indication.  Final apical dissection was performed in the anterior plane placing the prostate in superior traction and transecting the membranous urethra coldly.  This  completely freed up the prostatectomy. Specimen was placed in EndoCatch bag for later retrieval.  Digital rectal exam was performed using indicator glove under laparoscopic vision and no evidence of rectal violation was noted.  Next, posterior  reconstruction was performed using a 3-0 V-Loc suture, reapproximating the posterior urethral plate to the posterior bladder neck, placing the structures into tension-free apposition.  Next mucosa-to-mucosa anastomosis was performed using double armed  3-0 V-Loc suture from the 6 o'clock to the  12 o'clock position, which resulted in excellent tension-free anastomosis.  A new Foley catheter was then placed and irrigated quantitatively.  Sponge and needle counts were correct.  We achieved the goals of  the extirpative portion of the procedure today.  Closed  suction drain was brought through the previous left lateral most robotic port site into the peritoneal cavity.  Robot was then undocked.  The previous right lateral most assistant port site was  closed at the level of fascia using Carter-Thomason suture passer and 0 Vicryl.  Specimen was retrieved by extending the previous camera port site inferiorly for a total distance of approximately 3 cm, removing the prostatectomy specimen, setting aside  for permanent pathology.  The site was then closed over the fascia using figure-of-eight PDS x 3 followed by reapproximation of Scarpa's with running Vicryl.  All incision sites were infiltrated with dilute lipolyzed Marcaine and closed at the level of  the skin using subcuticular Monocryl followed by Dermabond.  Procedure was then terminated.  The patient tolerated procedure well, no immediate periprocedural complications.  The patient was taken to postanesthesia care unit in stable condition.  Plan  for observation admission.  Please note, first assistant, Debbrah Alar, was crucial for all portions of the surgery today.  She provided invaluable retraction, suctioning, specimen manipulation, robotic instrument exchange, vascular stapling, lymphatic clipping and general first  assistance.   MUK D: 05/08/2022 2:29:18 pm T: 05/08/2022 10:10:00 pm  JOB: [id number]/ [id number]

## 2022-05-08 NOTE — H&P (Signed)
William Cervantes is an 61 y.o. male.    Chief Complaint: Pre-OP Prostatectomy  HPI:   1 - High Risk Prostate Cancer - Grade 9 cancer on BX 2023 by Dr. Domenica Fail at [facility] on eval PSA 12. PET localized (mostly left sided disease base to apex). Prostae vol abtou 30gm, no median.   2 - Balanitis - occasional flair of balanitis. He is circumcised (got done on the [place] at age 7) but some buried penis. Manages with topical antifungal/steroid. He is on jardiance.   PMH sig for appy, obesity, DM2 (compliant). NO CV disese / blood thinners. 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 His PCP is with [facility]. His wife William Cervantes is very involved and Education officer, museum.   Today " William Cervantes " is seen to proceed with prostatectomy / node dissection for aggressive prostate cancer. No interval fevers. Hgb 16, Cr <1.   Past Medical History:  Diagnosis Date   Arthritis    hands   Cancer ([place])    prostate   Diabetes mellitus ([place])    Glaucoma    mother had it   History of kidney stones    Hyperlipidemia    Hypertension    Vitamin D deficiency     Past Surgical History:  Procedure Laterality Date   APPENDECTOMY      Family History  Problem Relation Age of Onset   Diabetes Mother    Hypertension Father    Heart attack Father        passed away at 80 with massive heart attack   Colon cancer Neg Hx    Liver disease Neg Hx    Social History:  reports that he has never smoked. He has never used smokeless tobacco. He reports current alcohol use. He reports that he does not use drugs.  Allergies:  Allergies  Allergen Reactions   Sulfa Antibiotics Swelling    No medications prior to admission.    No results found for this or any previous visit (from the past 48 hour(s)). No results found.  Review of Systems  Constitutional:  Negative for chills and fever.  All other systems reviewed and are negative.  There were no vitals  taken for this visit. Physical Exam Vitals reviewed.  HENT:     Head: Normocephalic.     Mouth/Throat:     Mouth: Mucous membranes are moist.  Eyes:     Pupils: Pupils are equal, round, and reactive to light.  Cardiovascular:     Rate and Rhythm: Normal rate.  Pulmonary:     Effort: Pulmonary effort is normal.  Abdominal:     Comments: Stable truncal obesity  Genitourinary:    Comments: Stable partially buried penis, no balanitis at present Musculoskeletal:        General: Normal range of motion.     Cervical back: Normal range of motion.  Skin:    General: Skin is warm.  Neurological:     General: No focal deficit present.     Mental Status: He is alert.  Psychiatric:        Mood and Affect: Mood normal.     Assessment/Plan  Proceed as planned with prostatectomy / node dissection. Risks, benefits, alternatives, expected peri-op course discussed previously and rietrated today.   Alexis Frock, MD 05/08/2022, 6:06 AM

## 2022-05-08 NOTE — Anesthesia Postprocedure Evaluation (Signed)
Anesthesia Post Note  Patient: William Cervantes  Procedure(s) Performed: XI ROBOTIC ASSISTED LAPAROSCOPIC RADICAL PROSTATECTOMY AND INDOCYANINE GREEN DYE INJECTION (Abdomen) PELVIC LYMPH NODE DISSECTION (Bilateral: Abdomen)     Patient location during evaluation: PACU Anesthesia Type: General Level of consciousness: awake and alert Pain management: pain level controlled Vital Signs Assessment: post-procedure vital signs reviewed and stable Respiratory status: spontaneous breathing, nonlabored ventilation, respiratory function stable and patient connected to nasal cannula oxygen Cardiovascular status: blood pressure returned to baseline and stable Postop Assessment: no apparent nausea or vomiting Anesthetic complications: yes   Encounter Notable Events  Notable Event Outcome Phase Comment  Difficult to intubate - expected  Intraprocedure Filed from anesthesia note documentation.    Last Vitals:  Vitals:   05/08/22 1630 05/08/22 1730  BP: (!) 155/86 (!) 147/86  Pulse: 88 94  Resp: 13 14  Temp: 37.1 C   SpO2: 95% 92%    Last Pain:  Vitals:   05/08/22 1730  TempSrc:   PainSc: [place]

## 2022-05-08 NOTE — Discharge Instructions (Addendum)

## 2022-05-08 NOTE — Transfer of Care (Signed)
Immediate Anesthesia Transfer of Care Note  Patient: William Cervantes  Procedure(s) Performed: XI ROBOTIC ASSISTED LAPAROSCOPIC RADICAL PROSTATECTOMY AND INDOCYANINE GREEN DYE INJECTION (Abdomen) PELVIC LYMPH NODE DISSECTION (Bilateral: Abdomen)  Patient Location: PACU  Anesthesia Type:General  Level of Consciousness: awake, alert , and patient cooperative  Airway & Oxygen Therapy: Patient Spontanous Breathing and Patient connected to face mask oxygen  Post-op Assessment: Report given to RN and Post -op Vital signs reviewed and stable and patient moving both arms with strong left hand grip on command.  Post vital signs: Reviewed and stable B/P 131/76.    Last Vitals:  Vitals Value Taken Time  BP    Temp    Pulse 95 05/08/22 1422  Resp 19 05/08/22 1422  SpO2 96 % 05/08/22 1422  Vitals shown include unvalidated device data.  Last Pain:  Vitals:   05/08/22 0918  TempSrc:   PainSc: 0-No pain         Complications:  Encounter Notable Events  Notable Event Outcome Phase Comment  Difficult to intubate - expected  Intraprocedure Filed from anesthesia note documentation.

## 2022-05-08 NOTE — Brief Op Note (Signed)
05/08/2022  2:20 PM  PATIENT:  William Cervantes  61 y.o. male  PRE-OPERATIVE DIAGNOSIS:  PROSTATE CANCER  POST-OPERATIVE DIAGNOSIS:  PROSTATE CANCER  PROCEDURE:  Procedure(s) with comments: XI ROBOTIC ASSISTED LAPAROSCOPIC RADICAL PROSTATECTOMY AND INDOCYANINE GREEN DYE INJECTION (N/A) - 3 HRS PELVIC LYMPH NODE DISSECTION (Bilateral)  SURGEON:  Surgeon(s) and Role:    * Alexis Frock, MD - Primary  PHYSICIAN ASSISTANT:   ASSISTANTS: Clemetine Marker PA   ANESTHESIA:   local and general  EBL:  450 mL   BLOOD ADMINISTERED:none  DRAINS:  1 - JP to bulb; 2 - FOley to gravity    LOCAL MEDICATIONS USED:  MARCAINE     SPECIMEN:  Source of Specimen:  1 - pelvic lymph nodes; 2 - prostatectomy  DISPOSITION OF SPECIMEN:  PATHOLOGY  COUNTS:  YES  TOURNIQUET:  * No tourniquets in log *  DICTATION: .Other Dictation: Dictation Number [id number]  PLAN OF CARE: Admit for overnight observation  PATIENT DISPOSITION:  PACU - hemodynamically stable.   Delay start of Pharmacological VTE agent (>24hrs) due to surgical blood loss or risk of bleeding: yes

## 2022-05-08 NOTE — Anesthesia Preprocedure Evaluation (Addendum)
Anesthesia Evaluation  Patient identified by MRN, date of birth, ID band Patient awake    Reviewed: Allergy & Precautions, NPO status , Patient's Chart, lab work & pertinent test results  Airway Mallampati: III  TM Distance: >3 FB Neck ROM: Full    Dental  (+) Teeth Intact, Dental Advisory Given   Pulmonary neg pulmonary ROS   breath sounds clear to auscultation       Cardiovascular hypertension, Pt. on medications  Rhythm:Regular Rate:Normal     Neuro/Psych negative neurological ROS  negative psych ROS   GI/Hepatic negative GI ROS, Neg liver ROS,,,  Endo/Other  diabetes, Type 2, Oral Hypoglycemic Agents    Renal/GU      Musculoskeletal  (+) Arthritis ,    Abdominal   Peds  Hematology negative hematology ROS (+)   Anesthesia Other Findings   Reproductive/Obstetrics                             Anesthesia Physical Anesthesia Plan  ASA: 2  Anesthesia Plan: General   Post-op Pain Management:    Induction: Intravenous  PONV Risk Score and Plan: 3 and Ondansetron, Dexamethasone and Midazolam  Airway Management Planned: Oral ETT  Additional Equipment: None  Intra-op Plan:   Post-operative Plan: Extubation in OR  Informed Consent: I have reviewed the patients History and Physical, chart, labs and discussed the procedure including the risks, benefits and alternatives for the proposed anesthesia with the patient or authorized representative who has indicated his/her understanding and acceptance.     Dental advisory given  Plan Discussed with: CRNA  Anesthesia Plan Comments: (- 2 IV's)       Anesthesia Quick Evaluation

## 2022-05-08 NOTE — Anesthesia Procedure Notes (Signed)
Procedure Name: Intubation Date/Time: 05/08/2022 11:12 AM  Performed by: Lollie Sails, CRNAPre-anesthesia Checklist: Patient identified, Emergency Drugs available, Suction available, Patient being monitored and Timeout performed Patient Re-evaluated:Patient Re-evaluated prior to induction Oxygen Delivery Method: Circle system utilized Preoxygenation: Pre-oxygenation with 100% oxygen Induction Type: IV induction Ventilation: Mask ventilation with difficulty and Oral airway inserted - appropriate to patient size Laryngoscope Size: Miller and 3 Grade View: Grade II Tube type: Oral Tube size: 7.5 mm Number of attempts: 1 Airway Equipment and Method: Stylet Placement Confirmation: ETT inserted through vocal cords under direct vision, positive ETCO2 and breath sounds checked- equal and bilateral Secured at: 23 cm Tube secured with: Tape Dental Injury: Teeth and Oropharynx as per pre-operative assessment  Difficulty Due To: Difficulty was anticipated, Difficult Airway- due to large tongue and Difficult Airway- due to anterior larynx Comments: Able to mask with oral airway in place.   Grade II view external laryngeal pressure.

## 2022-05-09 ENCOUNTER — Encounter (HOSPITAL_COMMUNITY): Payer: Self-pay | Admitting: Urology

## 2022-05-09 DIAGNOSIS — C61 Malignant neoplasm of prostate: Secondary | ICD-10-CM | POA: Diagnosis not present

## 2022-05-09 LAB — BASIC METABOLIC PANEL
Anion gap: 5 (ref 5–15)
BUN: 12 mg/dL (ref 8–23)
CO2: 28 mmol/L (ref 22–32)
Calcium: 8.4 mg/dL — ABNORMAL LOW (ref 8.9–10.3)
Chloride: 104 mmol/L (ref 98–111)
Creatinine, Ser: 0.71 mg/dL (ref 0.61–1.24)
GFR, Estimated: >60 mL/min (ref >60)
Glucose, Bld: 177 mg/dL — ABNORMAL HIGH (ref 70–99)
Potassium: 3.9 mmol/L (ref 3.5–5.1)
Sodium: 137 mmol/L (ref 135–145)

## 2022-05-09 LAB — HEMOGLOBIN AND HEMATOCRIT, BLOOD
HCT: 38 % — ABNORMAL LOW (ref 39.0–52.0)
Hemoglobin: 13.1 g/dL (ref 13.0–17.0)

## 2022-05-09 LAB — GLUCOSE, CAPILLARY
Glucose-Capillary: 164 mg/dL — ABNORMAL HIGH (ref 70–99)
Glucose-Capillary: 145 mg/dL — ABNORMAL HIGH (ref 70–99)

## 2022-05-09 LAB — CREATININE, FLUID (PLEURAL, PERITONEAL, JP DRAINAGE): Creat, Fluid: 0.6 mg/dL

## 2022-05-09 NOTE — Progress Notes (Signed)
Urology Inpatient Progress Report  Prostate cancer ([place]) [C61]  Procedure(s): XI ROBOTIC ASSISTED LAPAROSCOPIC RADICAL PROSTATECTOMY AND INDOCYANINE GREEN DYE INJECTION PELVIC LYMPH NODE DISSECTION  1 Day Post-Op   Intv/Subj: No acute events overnight. Patient is without complaint.  Ambulating.  Awaiting JP creatinine.  Principal Problem:   Prostate cancer ([place])  Current Facility-Administered Medications  Medication Dose Route Frequency Provider Last Rate Last Admin   atorvastatin (LIPITOR) tablet 20 mg  20 mg Oral QHS Debbrah Alar, PA-C   20 mg at 05/08/22 2151   diphenhydrAMINE (BENADRYL) injection 12.5-25 mg  12.5-25 mg Intravenous Q6H PRN Debbrah Alar, PA-C       Or   diphenhydrAMINE (BENADRYL) 12.5 MG/5ML elixir 12.5-25 mg  12.5-25 mg Oral Q6H PRN Debbrah Alar, PA-C       docusate sodium (COLACE) capsule 100 mg  100 mg Oral BID Debbrah Alar, PA-C   100 mg at 05/09/22 4132   hydrochlorothiazide (HYDRODIURIL) tablet 12.5 mg  12.5 mg Oral Daily Debbrah Alar, PA-C   12.5 mg at 05/09/22 4401   HYDROmorphone (DILAUDID) injection 0.5-1 mg  0.5-1 mg Intravenous Q2H PRN Debbrah Alar, PA-C       hyoscyamine (LEVSIN SL) SL tablet 0.125 mg  0.125 mg Sublingual Q4H PRN Dancy, Estill Bamberg, PA-C       insulin aspart (novoLOG) injection 0-15 Units  0-15 Units Subcutaneous TID WC Debbrah Alar, PA-C   2 Units at 05/09/22 1256   insulin aspart (novoLOG) injection 0-5 Units  0-5 Units Subcutaneous QHS Dancy, Amanda, PA-C       latanoprost (XALATAN) 0.005 % ophthalmic solution 1 drop  1 drop Both Eyes QHS Dancy, Estill Bamberg, PA-C   1 drop at 05/08/22 2152   losartan (COZAAR) tablet 25 mg  25 mg Oral Daily Debbrah Alar, PA-C   25 mg at 05/09/22 0272   neomycin-bacitracin-polymyxin [id number] OINT 1 Application  1 Application Topical TID PRN Debbrah Alar, PA-C       ondansetron (ZOFRAN) injection 4 mg  4 mg Intravenous Q4H PRN Debbrah Alar, PA-C       oxyCODONE (Oxy IR/ROXICODONE) immediate release  tablet 5 mg  5 mg Oral Q4H PRN Debbrah Alar, PA-C         Objective: Vital: Vitals:   05/08/22 1828 05/08/22 2100 05/09/22 0010 05/09/22 1030  BP: (!) 151/88 (!) 149/81 108/67 117/71  Pulse: (!) 101 92 78 73  Resp: '20 19 19 14  '$ Temp: 99.1 F (37.3 C) 98.7 F (37.1 C) 98.2 F (36.8 C) 98.1 F (36.7 C)  TempSrc: Oral Oral Oral Oral  SpO2: 93% 96% 95% 97%  Weight:      Height:       I/Os: I/O last 3 completed shifts: In: 3429.3 [P.O.:480; I.V.:2599.3; IV Piggyback:350] Out: 2910 [Urine:2300; Drains:160; Blood:450]  Physical Exam:  General: Patient is in no apparent distress Lungs: Normal respiratory effort, chest expands symmetrically. GI: Incisions are c/d/i.The abdomen is soft and appropriately tender without mass. JP drain with serosanguinous drainage Foley: Draining clear yellow urine Ext: lower extremities symmetric  Lab Results: Recent Labs    05/08/22 1513 05/09/22 0558  HGB 14.3 13.1  HCT 41.6 38.0*   Recent Labs    05/09/22 0558  NA 137  K 3.9  CL 104  CO2 28  GLUCOSE 177*  BUN 12  CREATININE 0.71  CALCIUM 8.4*   No results for input(s): "LABPT", "INR" in the last 72 hours. No results for input(s): "LABURIN" in the last 72 hours. No results  found for this or any previous visit.  Studies/Results: No results found.  Assessment: Prostate cancer Procedure(s): XI ROBOTIC ASSISTED LAPAROSCOPIC RADICAL PROSTATECTOMY AND INDOCYANINE GREEN DYE INJECTION PELVIC LYMPH NODE DISSECTION, 1 Day Post-Op  doing well.  Plan: Advance diet.  Discontinue IV fluids.  Send JP creatinine.  If that is normal we will remove the JP drain.  Discharge if JP creatinine normal and able to get that out.   Link Snuffer, MD Urology 05/09/2022, 1:04 PM

## 2022-05-09 NOTE — Discharge Summary (Signed)
Physician Discharge Summary  Patient ID: William Cervantes MRN: [id number] DOB/AGE: [birth year] 61 y.o.  Admit date: 05/08/2022 Discharge date: 05/09/2022  Admission Diagnoses:  Discharge Diagnoses:  Principal Problem:   Prostate cancer [facility])   Discharged Condition: good  Hospital Course: Patient underwent a robotic assisted laparoscopic radical prostatectomy with bilateral pelvic lymph node dissection.  He remained overnight under observation.  The following day, JP creatinine was consistent with serum and was removed.  He was tolerating a diet, ambulating and had a small amount of flatus.  He was discharged home with Foley catheter.  Consults: None  Significant Diagnostic Studies: None  Treatments: surgery: As above  Discharge Exam: Blood pressure 117/71, pulse 73, temperature 98.1 F (36.7 C), temperature source Oral, resp. rate 14, height '5\' 9"'$  (1.753 m), weight 111.7 kg, SpO2 97 %. General appearance: alert no acute distress Adequate perfusion of extremities Nonlabored respiration Abdomen soft, appropriately tender, nondistended, JP with serosanguineous fluid Foley catheter draining clear yellow urine  Disposition: Discharge disposition: 01-Home or Self Care        Allergies as of 05/09/2022       Reactions   Sulfa Antibiotics Swelling        Medication List     STOP taking these medications    FISH OIL ULTRA PO   ibuprofen 600 MG tablet Commonly known as: ADVIL   multivitamin with minerals Tabs tablet   VITAMIN D-3 PO       TAKE these medications    atorvastatin 20 MG tablet Commonly known as: LIPITOR Take 20 mg by mouth at bedtime.   ciprofloxacin 500 MG tablet Commonly known as: Cipro Take 1 tablet (500 mg total) by mouth 2 (two) times daily. Start day prior to office visit for foley removal   clotrimazole-betamethasone cream Commonly known as: LOTRISONE Apply 1 Application topically 2 (two) times daily.   docusate sodium 100 MG  capsule Commonly known as: COLACE Take 1 capsule (100 mg total) by mouth 2 (two) times daily.   glipiZIDE 5 MG tablet Commonly known as: GLUCOTROL Take 5 mg by mouth in the morning and at bedtime.   hydrochlorothiazide 25 MG tablet Commonly known as: HYDRODIURIL Take 12.5 mg by mouth in the morning.   HYDROcodone-acetaminophen 5-325 MG tablet Commonly known as: Norco Take 1-2 tablets by mouth every 6 (six) hours as needed for moderate pain or severe pain.   Jardiance 25 MG Tabs tablet Generic drug: empagliflozin Take 12.5 mg by mouth in the morning.   latanoprost 0.005 % ophthalmic solution Commonly known as: XALATAN Place 1 drop into both eyes at bedtime.   losartan 50 MG tablet Commonly known as: COZAAR Take 25 mg by mouth in the morning.   metFORMIN 1000 MG tablet Commonly known as: GLUCOPHAGE Take 1,000 mg by mouth 2 (two) times daily.        Follow-up Information     Alexis Frock, MD Follow up on 05/18/2022.   Specialty: Urology Why: at 10 AM for MD visit, catheter removal, and pathology revieew. Contact information: [place] [address] [phone]                 Signed: Marton Redwood, III 05/09/2022, 1:52 PM

## 2022-05-13 LAB — SURGICAL PATHOLOGY

## 2022-09-14 ENCOUNTER — Other Ambulatory Visit (HOSPITAL_COMMUNITY): Payer: Self-pay | Admitting: Internal Medicine

## 2022-09-14 DIAGNOSIS — K7460 Unspecified cirrhosis of liver: Secondary | ICD-10-CM

## 2022-09-24 ENCOUNTER — Ambulatory Visit (HOSPITAL_COMMUNITY)
Admission: RE | Admit: 2022-09-24 | Discharge: 2022-09-24 | Disposition: A | Discharge status: Home / Self Care | Payer: No Typology Code available for payment source | Source: Ambulatory Visit | Attending: Internal Medicine | Admitting: Internal Medicine

## 2022-09-24 DIAGNOSIS — K7460 Unspecified cirrhosis of liver: Secondary | ICD-10-CM | POA: Insufficient documentation

## 2022-11-05 NOTE — Progress Notes (Signed)
GI Office Note    Referring Provider: Wilhemina Bonito., * Primary Care Physician:  Wilhemina Bonito., MD  Primary Gastroenterologist: Roetta Sessions, MD   Chief Complaint   Chief Complaint  Patient presents with   Cirrhosis    History of Present Illness   William Cervantes is a 62 y.o. male presenting today at the request of Burton Apley for further evaluation of "cirrhosis".  Patient last seen in 2020 for chronic liver disease. CT abdomen and pelvis without contrast done on August 2020, showed mild nodular contour of the liver which can be seen with cirrhosis.  Tiny calcified gallstones.  Severe sigmoid colon diverticulosis.  Engorged left retroperitoneal vessels communicating with the left renal vein, left gonadal vein, splenic vein suggesting portosystemic shunting. Labs at that time showed negative viral markers for hepatitis B and C.  No iron overload.  Slightly elevated IgA/IgG felt to be insignificant.  Autoimmune hepatitis, PBC markers otherwise negative.  Celiac screen negative.  Alpha 1 antitrypsin level normal.  Fibrosis score of F2.  He was advised to complete hepatitis a and B vaccinations.  Recommended office visit in 6 months to follow-up liver fibrosis/?Early cirrhosis, but lost to follow up.  Today: since we last saw him, he was diagnosed with prostate cancer. He completed robotic assisted laparoscopic radical prostatectomy in 04/2022. He reports his last PSA was 0. He reports that labs from the [place] prompted today's visit. He has u/s as outlined below. He is feeling well. His energy levels are finally back to where they were before his prostate surgery. He denies abdominal pain. BMs good. No melena, brbpr. No pruritus. Appetite is good. No N/V. No heartburn.   Patient believes he may not have completed the Hep A/B vaccines, was having done through [facility]. He retired May 7 (this week).   He has history of claustrophobia, was able to complete MRI prostate but  was difficult for him.    November 2023: Underwent robotic assisted laparoscopic radical prostatectomy for prostate cancer.  Right upper quadrant ultrasound March 2024: Heterogeneous increased echotexture of the liver, nonspecific but can be seen in cirrhosis.  Wt Readings from Last 3 Encounters:  11/06/22 263 lb 3.2 oz (119.4 kg)  05/08/22 246 lb 4.1 oz (111.7 kg)  05/01/22 246 lb 3.2 oz (111.7 kg)     Medications   Current Outpatient Medications  Medication Sig Dispense Refill   atorvastatin (LIPITOR) 20 MG tablet Take 20 mg by mouth at bedtime.     cholecalciferol (VITAMIN D3) 25 MCG (1000 UNIT) tablet Take 1,000 Units by mouth daily.     glipiZIDE (GLUCOTROL) 5 MG tablet Take 5 mg by mouth in the morning and at bedtime.     hydrochlorothiazide (HYDRODIURIL) 25 MG tablet Take 12.5 mg by mouth in the morning.     latanoprost (XALATAN) 0.005 % ophthalmic solution Place 1 drop into both eyes at bedtime.     losartan (COZAAR) 50 MG tablet Take 25 mg by mouth in the morning.     metFORMIN (GLUCOPHAGE) 1000 MG tablet Take 1,000 mg by mouth 2 (two) times daily.     Multiple Vitamin (MULTIVITAMIN) capsule Take 1 capsule by mouth daily.     Omega-3 Fatty Acids (FISH OIL) 1000 MG CAPS Take 1 capsule by mouth daily.     pioglitazone (ACTOS) 30 MG tablet TAKE ONE TABLET BY MOUTH DAILY FOR DIABETES FOR DIABETES     No current facility-administered medications for this visit.  Allergies   Allergies as of 11/06/2022 - Review Complete 11/06/2022  Allergen Reaction Noted   Sulfa antibiotics Swelling 04/21/2019     Past Medical History   Past Medical History:  Diagnosis Date   Arthritis    hands   Cancer (HCC)    prostate   Diabetes mellitus (HCC)    Glaucoma    mother had it   History of kidney stones    Hyperlipidemia    Hypertension    Vitamin D deficiency     Past Surgical History   Past Surgical History:  Procedure Laterality Date   APPENDECTOMY     LYMPH NODE  DISSECTION Bilateral 05/08/2022   Procedure: PELVIC LYMPH NODE DISSECTION;  Surgeon: Sebastian Ache, MD;  Location: [facility];  Service: Urology;  Laterality: Bilateral;   ROBOT ASSISTED LAPAROSCOPIC RADICAL PROSTATECTOMY N/A 05/08/2022   Procedure: XI ROBOTIC ASSISTED LAPAROSCOPIC RADICAL PROSTATECTOMY AND INDOCYANINE GREEN DYE INJECTION;  Surgeon: Sebastian Ache, MD;  Location: [facility];  Service: Urology;  Laterality: N/A;  3 HRS    Past Family History   Family History  Problem Relation Age of Onset   Diabetes Mother    Hypertension Father    Heart attack Father        passed away at 24 with massive heart attack   Colon cancer Neg Hx    Liver disease Neg Hx     Past Social History   Social History   Socioeconomic History   Marital status: Married    Spouse name: Not on file   Number of children: Not on file   Years of education: Not on file   Highest education level: Not on file  Occupational History   Not on file  Tobacco Use   Smoking status: Never   Smokeless tobacco: Never  Vaping Use   Vaping Use: Not on file  Substance and Sexual Activity   Alcohol use: Yes    Comment: occ beer, never had heavy use.    Drug use: Never   Sexual activity: Yes  Other Topics Concern   Not on file  Social History Narrative   Not on file   Social Determinants of Health   Financial Resource Strain: Not on file  Food Insecurity: No Food Insecurity (05/08/2022)   Hunger Vital Sign    Worried About Running Out of Food in the Last Year: Never true    Ran Out of Food in the Last Year: Never true  Transportation Needs: No Transportation Needs (05/08/2022)   PRAPARE - Administrator, Civil Service (Medical): No    Lack of Transportation (Non-Medical): No  Physical Activity: Not on file  Stress: Not on file  Social Connections: Not on file  Intimate Partner Violence: Not At Risk (05/08/2022)   Humiliation, Afraid, Rape, and Kick questionnaire    Fear of Current or  Ex-Partner: No    Emotionally Abused: No    Physically Abused: No    Sexually Abused: No    Review of Systems   General: Negative for anorexia, weight loss, fever, chills, fatigue, weakness. ENT: Negative for hoarseness, difficulty swallowing , nasal congestion. CV: Negative for chest pain, angina, palpitations, dyspnea on exertion, peripheral edema.  Respiratory: Negative for dyspnea at rest, dyspnea on exertion, cough, sputum, wheezing.  GI: See history of present illness. GU:  Negative for dysuria, hematuria, urinary incontinence, urinary frequency, nocturnal urination.  Endo: Negative for unusual weight change.     Physical Exam   BP 127/79 (  BP Location: Right Arm, Patient Position: Sitting, Cuff Size: Large)   Pulse 73   Temp (!) 97.4 F (36.3 C) (Oral)   Ht 5\' 9"  (1.753 m)   Wt 263 lb 3.2 oz (119.4 kg)   SpO2 96%   BMI 38.87 kg/m    General: Well-nourished, well-developed in no acute distress.  Eyes: No icterus. Mouth: Oropharyngeal mucosa moist and pink , no lesions erythema or exudate. Lungs: Clear to auscultation bilaterally.  Heart: Regular rate and rhythm, no murmurs rubs or gallops.  Abdomen: Bowel sounds are normal, nontender, nondistended, no hepatosplenomegaly or masses,  no abdominal bruits or hernia , no rebound or guarding.  Rectal: not performed Extremities: No lower extremity edema. No clubbing or deformities. Neuro: Alert and oriented x 4   Skin: Warm and dry, no jaundice.   Psych: Alert and cooperative, normal mood and affect.  Labs   Requested from [place]. Through Care Everywhere I could see some labs as listed below.  March 2024: PSA less than 0.010, A1c 8.1, total cholesterol 120, LDL 38, BUN 12, glucose 173, sodium 137, potassium 4.5, albumin 3.8, total bilirubin 0.7, alkaline phosphatase 183, AST 21, ALT 38, creatinine, plasma 0.819, white blood cell count 6860, hemoglobin 15.7, MCV 87.1, platelets 187,000, TSH 0.76.  Imaging Studies   No  results found.  Assessment   Chronic liver disease: Referral back for concern for cirrhosis.  Evaluated for the same back in October 2020.  CT at outside facility 2020 with questionable mild nodular contour of the liver.  Engorged left retroperitoneal vessels communicate with the left renal vein, left gonadal vein, splenic vein suggesting portosystemic shunting.  Workup at that time with labs as outlined above, FibroSure suggesting F2 disease.  Outside of nonspecific mildly elevated IgG/IgA other serologies unremarkable.  It was felt that he likely had NASH in the setting of obesity and diabetes.  Patient presents back today, clinically doing well.  No evidence of decompensated liver disease.  Recent right upper quadrant ultrasound with heterogeneous increased echotexture of the liver, somewhat nonspecific but could be seen in the setting of cirrhosis.  Further evaluation needed, he may have advanced fibrosis, cannot exclude early cirrhosis.   PLAN   At this time we will update labs, recheck fibrosis score, immunoglobulins.  Will check for hepatitis A and B immunity, it is not clear that he vaccine series. Further recommendations to follow.    Leanna Battles. Melvyn Neth, MHS, PA-C [facility]

## 2022-11-06 ENCOUNTER — Telehealth: Payer: Self-pay | Admitting: Gastroenterology

## 2022-11-06 ENCOUNTER — Ambulatory Visit (INDEPENDENT_AMBULATORY_CARE_PROVIDER_SITE_OTHER): Payer: No Typology Code available for payment source | Admitting: Gastroenterology

## 2022-11-06 ENCOUNTER — Encounter: Payer: Self-pay | Admitting: Gastroenterology

## 2022-11-06 VITALS — BP 127/79 | HR 73 | Temp 97.4°F | Ht 69.0 in | Wt 263.2 lb

## 2022-11-06 DIAGNOSIS — K769 Liver disease, unspecified: Secondary | ICD-10-CM

## 2022-11-06 NOTE — Patient Instructions (Signed)
Please complete labs at [facility]. We will call with results as available.  We will request copy of the labs done at the [place]. [facility] number we have on paperwork is [phone]. We also have the following number which could be for providers to use, [phone] ex [id number].   It was a pleasure to see you today. I want to create trusting relationships with patients and provide genuine, compassionate, and quality care. I truly value your feedback, so please be on the lookout for a survey regarding your visit with me today. I appreciate your time in completing this!

## 2022-11-06 NOTE — Telephone Encounter (Signed)
Tammy, will you check with [facility] and make sure they use the [place] reference number when they bill, [id number]. Want to make sure this vet done not have to pay for labs. They should be covered under the approved service for cirrhosis.

## 2022-11-10 NOTE — Telephone Encounter (Signed)
Spoke with [facility] today and they said that I will need to call back in a couple days once claim has been processed.

## 2022-11-12 ENCOUNTER — Telehealth: Payer: Self-pay

## 2022-11-12 NOTE — Telephone Encounter (Signed)
Pt called wanting to know his results to his recent labs.

## 2022-11-15 NOTE — Telephone Encounter (Signed)
See result note.  

## 2022-11-17 NOTE — Telephone Encounter (Signed)
[facility] billing has been provided with [facility] reference number

## 2022-11-19 LAB — LIVER FIBROSIS, FIBROTEST-ACTITEST
ALT: 23 U/L (ref 9–46)
Alpha-2-Macroglobulin: 371 mg/dL — ABNORMAL HIGH (ref 106–279)
Apolipoprotein A1: 167 mg/dL (ref 94–176)
Bilirubin: 0.6 mg/dL (ref 0.2–1.2)
Fibrosis Score: 0.5
GGT: 18 U/L (ref 3–70)
Haptoglobin: 130 mg/dL (ref 43–212)
Necroinflammat ACT Score: 0.13
Reference ID: 4927989

## 2022-11-19 LAB — CBC WITH DIFFERENTIAL/PLATELET
Absolute Monocytes: 458 cells/uL (ref 200–950)
Basophils Absolute: 68 cells/uL (ref 0–200)
Basophils Relative: 1.3 %
Eosinophils Absolute: 172 cells/uL (ref 15–500)
Eosinophils Relative: 3.3 %
HCT: 40.8 % (ref 38.5–50.0)
Hemoglobin: 14 g/dL (ref 13.2–17.1)
Lymphs Abs: 1414 cells/uL (ref 850–3900)
MCH: 31.3 pg (ref 27.0–33.0)
MCHC: 34.3 g/dL (ref 32.0–36.0)
MCV: 91.3 fL (ref 80.0–100.0)
MPV: 10.4 fL (ref 7.5–12.5)
Monocytes Relative: 8.8 %
Neutro Abs: 3089 cells/uL (ref 1500–7800)
Neutrophils Relative %: 59.4 %
Platelets: 177 10*3/uL (ref 140–400)
RBC: 4.47 10*6/uL (ref 4.20–5.80)
RDW: 11.8 % (ref 11.0–15.0)
Total Lymphocyte: 27.2 %
WBC: 5.2 10*3/uL (ref 3.8–10.8)

## 2022-11-19 LAB — HEPATITIS B SURFACE ANTIBODY,QUALITATIVE: Hep B S Ab: REACTIVE — AB

## 2022-11-19 LAB — COMPREHENSIVE METABOLIC PANEL
AG Ratio: 1.3 (calc) (ref 1.0–2.5)
ALT: 25 U/L (ref 9–46)
AST: 23 U/L (ref 10–35)
Albumin: 4 g/dL (ref 3.6–5.1)
Alkaline phosphatase (APISO): 77 U/L (ref 35–144)
BUN: 14 mg/dL (ref 7–25)
CO2: 28 mmol/L (ref 20–32)
Calcium: 9.3 mg/dL (ref 8.6–10.3)
Chloride: 102 mmol/L (ref 98–110)
Creat: 0.83 mg/dL (ref 0.70–1.35)
Globulin: 3.2 g/dL (calc) (ref 1.9–3.7)
Glucose, Bld: 234 mg/dL — ABNORMAL HIGH (ref 65–139)
Potassium: 4.2 mmol/L (ref 3.5–5.3)
Sodium: 138 mmol/L (ref 135–146)
Total Bilirubin: 0.8 mg/dL (ref 0.2–1.2)
Total Protein: 7.2 g/dL (ref 6.1–8.1)

## 2022-11-19 LAB — PROTIME-INR
INR: 1.1
Prothrombin Time: 11.1 s (ref 9.0–11.5)

## 2022-11-19 LAB — IGG, IGA, IGM
IgG (Immunoglobin G), Serum: 1541 mg/dL — ABNORMAL HIGH (ref 600–1540)
IgM, Serum: 60 mg/dL (ref 50–300)
Immunoglobulin A: 329 mg/dL — ABNORMAL HIGH (ref 70–320)

## 2022-11-19 LAB — AFP TUMOR MARKER: AFP-Tumor Marker: 3.9 ng/mL (ref <6.1)

## 2022-11-19 LAB — HEPATITIS A ANTIBODY, TOTAL: Hepatitis A AB,Total: NONREACTIVE

## 2022-12-04 ENCOUNTER — Telehealth: Payer: Self-pay | Admitting: Gastroenterology

## 2022-12-04 NOTE — Telephone Encounter (Signed)
Tammy C, please make sure patient reads mychart message I sent today.  Mandy, please schedule OV with Dr. Jena Gauss or ME in 03/2023 for liver disease.

## 2022-12-07 NOTE — Telephone Encounter (Signed)
Pt was made aware of myChart message and is ready to schedule Oct visit when schedule is available.

## 2023-03-16 ENCOUNTER — Encounter: Payer: Self-pay | Admitting: Internal Medicine

## 2023-08-23 ENCOUNTER — Encounter: Payer: Self-pay | Admitting: Radiation Oncology

## 2023-08-23 NOTE — Progress Notes (Addendum)
 GU Location of Tumor / Histology: Prostate Ca  Prostatectomy 05/07/2022    William Cervantes presented as referral from Dr. Sebastian Ache [facility]) elevated PSA, Balanitis, Stress urinary incontinence, and ED.   11/21/2021 Dr. Francesco Runner NM PET ([facility]) Skull to Mid Thigh CLINICAL DATA: Prostate carcinoma with biochemical recurrence.  FINDINGS: NECK No radiotracer activity in neck lymph nodes.   Incidental CT finding: None   CHEST No radiotracer accumulation within mediastinal or hilar lymph nodes. No suspicious pulmonary nodules on the CT scan.   Incidental CT finding: None   ABDOMEN/PELVIS Prostate: Intense activity within the LEFT lobe of the prostate gland with SUV max equal 11.4. This is a broad lesion involving large portion of the LEFT gland.   Lymph nodes: No abnormal radiotracer accumulation within pelvic or abdominal nodes.   Liver: No evidence of liver metastasis   Incidental CT finding: None   SKELETON No focal  activity to suggest skeletal metastasis.   IMPRESSION: 1. No evidence local prostate cancer recurrence in the prostatectomy bed. 2. No evidence metastatic adenopathy in the pelvis or periaortic retroperitoneum. 3. No evidence of visceral metastasis or skeletal metastasis.     Past/Anticipated interventions by urology, if any:     Past/Anticipated interventions by medical oncology, if any: NA  Weight changes, if any:  No  IPSS: 4 SHIM:  5  Bowel/Bladder complaints, if any:  Urinary frequency.  Nausea/Vomiting, if any: No  Pain issues, if any:  7-8/10 right shoulder/bicep 08/25/2023 seen by [facility] [place].  [facility]10/2023 a Dr. Dahlia Byes.  Will see Dr. Su Hilt soon.  SAFETY ISSUES: Prior radiation?  No Pacemaker/ICD?  No Possible current pregnancy? Male Is the patient on methotrexate? No  Current Complaints / other details:  Did receive shot of Tramadol that  helped with pain in right shoulder/bicep.

## 2023-08-30 NOTE — Progress Notes (Signed)
 Radiation Oncology         [phone] ________________________________  Initial Outpatient Consultation  Name: William Cervantes MRN: [id number]  Date: 08/31/2023  DOB: 02-25-1961  QI:[id number], Allegra Lai., MD  Berneice Heinrich Delbert Phenix., *   REFERRING PHYSICIAN: Loletta Parish., *  DIAGNOSIS: 63 y.o. gentleman with a rising, detectable PSA of 0.11  s/p RALP 04/2022 for Stage pT3aN0, Gleason 4+5 prostate cancer    ICD-10-CM   1. Malignant neoplasm of prostate (HCC)  C61     2. Prostate cancer (HCC)  C61       HISTORY OF PRESENT ILLNESS: William Cervantes is a 63 y.o. male with a diagnosis of biochemically recurrent prostate cancer. He was initially diagnosed with Gleason 4+5 adenocarcinoma of the prostate on biopsy with Dr. Garald Braver at the [facility] in 10/2021 with a PSA of 12. A disease staging PSMA PET scan from 11/21/21 was negative. He elected to proceed with RALP on 05/08/22 under the care of Dr. Berneice Heinrich. Surgical pathology revealed Gleason 4+5 prostatic adenocarcinoma with positive margin and extraprostatic extension at the bladder neck. Seminal vesicles, other margins, and all 8 lymph nodes were negative. His postoperative PSA was undetectable and he has regained good bladder control.  Unfortunately, his PSA has risen since initial postoperative value-- at 0.045 in 01/2023 and most recently 0.11 on 07/28/2023.  The patient reviewed the pathology and PSA results with his urologist and he has kindly been referred today for discussion of potential salvage radiation treatment options.   PREVIOUS RADIATION THERAPY: No  PAST MEDICAL HISTORY:  Past Medical History:  Diagnosis Date   Arthritis    hands   Cancer (HCC)    prostate   Diabetes mellitus (HCC)    ED (erectile dysfunction)    Elevated PSA    Glaucoma    mother had it   History of kidney stones    Hyperlipidemia    Hypertension    Vitamin D deficiency       PAST SURGICAL HISTORY: Past Surgical History:  Procedure  Laterality Date   APPENDECTOMY     LYMPH NODE DISSECTION Bilateral 05/08/2022   Procedure: PELVIC LYMPH NODE DISSECTION;  Surgeon: Sebastian Ache, MD;  Location: [facility];  Service: Urology;  Laterality: Bilateral;   ROBOT ASSISTED LAPAROSCOPIC RADICAL PROSTATECTOMY N/A 05/08/2022   Procedure: XI ROBOTIC ASSISTED LAPAROSCOPIC RADICAL PROSTATECTOMY AND INDOCYANINE GREEN DYE INJECTION;  Surgeon: Sebastian Ache, MD;  Location: [facility];  Service: Urology;  Laterality: N/A;  3 HRS    FAMILY HISTORY:  Family History  Problem Relation Age of Onset   Diabetes Mother    Hypertension Father    Heart attack Father        passed away at 94 with massive heart attack   Colon cancer Neg Hx    Liver disease Neg Hx     SOCIAL HISTORY:  Social History   Socioeconomic History   Marital status: Married    Spouse name: Not on file   Number of children: Not on file   Years of education: Not on file   Highest education level: Not on file  Occupational History   Not on file  Tobacco Use   Smoking status: Never   Smokeless tobacco: Never  Vaping Use   Vaping status: Not on file  Substance and Sexual Activity   Alcohol use: Not Currently    Comment: occ beer, never had heavy use.    Drug use: Never   Sexual activity:  Yes  Other Topics Concern   Not on file  Social History Narrative   Not on file   Social Drivers of Health   Financial Resource Strain: Not on file  Food Insecurity: No Food Insecurity (05/08/2022)   Hunger Vital Sign    Worried About Running Out of Food in the Last Year: Never true    Ran Out of Food in the Last Year: Never true  Transportation Needs: No Transportation Needs (05/08/2022)   PRAPARE - Administrator, Civil Service (Medical): No    Lack of Transportation (Non-Medical): No  Physical Activity: Not on file  Stress: Not on file  Social Connections: Not on file  Intimate Partner Violence: Not At Risk (05/08/2022)   Humiliation, Afraid, Rape, and Kick  questionnaire    Fear of Current or Ex-Partner: No    Emotionally Abused: No    Physically Abused: No    Sexually Abused: No    ALLERGIES: Sulfa antibiotics  MEDICATIONS:  Current Outpatient Medications  Medication Sig Dispense Refill   acetaminophen (TYLENOL) 500 MG tablet Take 500 mg by mouth.     glipiZIDE (GLUCOTROL) 5 MG tablet Take 10 mg by mouth in the morning and at bedtime.     ibuprofen (ADVIL) 600 MG tablet Take by mouth.     atorvastatin (LIPITOR) 20 MG tablet Take 20 mg by mouth at bedtime.     cholecalciferol (VITAMIN D3) 25 MCG (1000 UNIT) tablet Take 1,000 Units by mouth daily.     hydrochlorothiazide (HYDRODIURIL) 25 MG tablet Take 12.5 mg by mouth in the morning.     latanoprost (XALATAN) 0.005 % ophthalmic solution Place 1 drop into both eyes at bedtime.     losartan (COZAAR) 50 MG tablet Take 25 mg by mouth in the morning.     metFORMIN (GLUCOPHAGE) 1000 MG tablet Take 1,000 mg by mouth 2 (two) times daily.     Multiple Vitamin (MULTIVITAMIN) capsule Take 1 capsule by mouth daily.     Omega-3 Fatty Acids (FISH OIL) 1000 MG CAPS Take 1 capsule by mouth daily.     pioglitazone (ACTOS) 30 MG tablet TAKE ONE TABLET BY MOUTH DAILY FOR DIABETES FOR DIABETES     No current facility-administered medications for this encounter.    REVIEW OF SYSTEMS:  On review of systems, the patient reports that he is doing well overall. He denies any chest pain, shortness of breath, cough, fevers, chills, night sweats, unintended weight changes. He denies any bowel disturbances, and denies abdominal pain, nausea or vomiting. He has some pain in the right upper arm s/p recent biceps tendon rupture at home while working on a car 08/27/23. His IPSS was 4, indicating minimal urinary symptoms. His SHIM was 5, indicating he has severe postoperative erectile dysfunction. A complete review of systems is obtained and is otherwise negative.    PHYSICAL EXAM:  Wt Readings from Last 3 Encounters:   08/31/23 269 lb 4 oz (122.1 kg)  11/06/22 263 lb 3.2 oz (119.4 kg)  05/08/22 246 lb 4.1 oz (111.7 kg)   Temp Readings from Last 3 Encounters:  08/31/23 97.7 F (36.5 C) (Temporal)  11/06/22 (!) 97.4 F (36.3 C) (Oral)  05/09/22 98.5 F (36.9 C) (Oral)   BP Readings from Last 3 Encounters:  08/31/23 (!) 143/96  11/06/22 127/79  05/09/22 116/69   Pulse Readings from Last 3 Encounters:  08/31/23 89  11/06/22 73  05/09/22 68   Pain Assessment Pain Score: 8  Pain  Loc: Shoulder (right bicep and shoulder)/10  In general this is a well appearing Caucasian man in no acute distress. He's alert and oriented x4 and appropriate throughout the examination. Cardiopulmonary assessment is negative for acute distress, and he exhibits normal effort.     KPS = 100  100 - Normal; no complaints; no evidence of disease. 90   - Able to carry on normal activity; minor signs or symptoms of disease. 80   - Normal activity with effort; some signs or symptoms of disease. 28   - Cares for self; unable to carry on normal activity or to do active work. 60   - Requires occasional assistance, but is able to care for most of his personal needs. 50   - Requires considerable assistance and frequent medical care. 40   - Disabled; requires special care and assistance. 30   - Severely disabled; hospital admission is indicated although death not imminent. 20   - Very sick; hospital admission necessary; active supportive treatment necessary. 10   - Moribund; fatal processes progressing rapidly. 0     - Dead  Karnofsky DA, Abelmann WH, Craver LS and [place] [facility] [phone]) The use of the nitrogen mustards in the palliative treatment of carcinoma: with particular reference to bronchogenic carcinoma Cancer 1 634-56  LABORATORY DATA:  Lab Results  Component Value Date   WBC 5.2 11/09/2022   HGB 14.0 11/09/2022   HCT 40.8 11/09/2022   MCV 91.3 11/09/2022   PLT 177 11/09/2022   Lab Results  Component Value Date    NA 138 11/09/2022   K 4.2 11/09/2022   CL 102 11/09/2022   CO2 28 11/09/2022   Lab Results  Component Value Date   ALT 25 11/09/2022   ALT 23 11/09/2022   AST 23 11/09/2022   GGT 18 11/09/2022   BILITOT 0.8 11/09/2022     RADIOGRAPHY: No results found.    IMPRESSION/PLAN: 1. 63 y.o. gentleman with a rising, detectable PSA of 0.11  s/p RALP 04/2022 for Stage pT3aN0, Gleason 4+5 prostate cancer Today we reviewed the findings and workup thus far.  We discussed the natural history of prostate cancer.  We reviewed the the implications of positive margins, extracapsular extension, and seminal vesicle involvement on the risk of prostate cancer recurrence. In his case, extraprostatic extension and a positive margin were present, now coupled with a rising, detectable postoperative PSA. We reviewed some of the evidence suggesting an advantage for patients who undergo adjuvant radiotherapy in this setting in terms of disease control and overall survival. We discussed radiation treatment directed to the prostatic fossa with regard to the logistics and delivery of external beam radiation treatment.  At the conclusion of our conversation, the patient is interested in moving forward with the recommended 7.5 week course of daily external beam therapy. We will share our discussion with Dr. Berneice Heinrich and have tentatively scheduled CT Simulation/treatment planning for 1 pm on Monday September 27, 2023, to allow him time to have orthopedic workup of the recent biceps tendon rupture. He has freely signed written consent to proceed today in the office and a copy of this document will be placed in his medical record. The patient appears to have a good understanding of his disease and our treatment recommendations which are of curative intent and is in agreement with the stated plan.  Therefore, we will move forward with treatment planning accordingly, in anticipation of beginning IMRT in the near future. We enjoyed meeting  him today and look  forward to continuing to participate in his care.  We personally spent 60 minutes in this encounter including chart review, reviewing radiological studies, meeting face-to-face with the patient, entering orders and completing documentation.     Marguarite Arbour, PA-C    Margaretmary Dys, MD  [facility]  Radiation Oncology Direct Dial: [phone]  Fax: [phone] [email or web address]  Skype  LinkedIn   This document serves as a record of services personally performed by Margaretmary Dys, MD and Marcello Fennel, PA-C. It was created on their behalf by Mickie Bail, a trained medical scribe. The creation of this record is based on the scribe's personal observations and the provider's statements to them. This document has been checked and approved by the attending provider.

## 2023-08-31 ENCOUNTER — Encounter: Payer: Self-pay | Admitting: Radiation Oncology

## 2023-08-31 ENCOUNTER — Ambulatory Visit
Admission: RE | Admit: 2023-08-31 | Discharge: 2023-08-31 | Disposition: A | Discharge status: Home / Self Care | Payer: Self-pay | Source: Ambulatory Visit | Attending: Radiation Oncology | Admitting: Radiation Oncology

## 2023-08-31 VITALS — BP 143/96 | HR 89 | Temp 97.7°F | Resp 18 | Ht 69.0 in | Wt 269.2 lb

## 2023-08-31 DIAGNOSIS — I10 Essential (primary) hypertension: Secondary | ICD-10-CM | POA: Diagnosis not present

## 2023-08-31 DIAGNOSIS — Z7984 Long term (current) use of oral hypoglycemic drugs: Secondary | ICD-10-CM | POA: Diagnosis not present

## 2023-08-31 DIAGNOSIS — E119 Type 2 diabetes mellitus without complications: Secondary | ICD-10-CM | POA: Insufficient documentation

## 2023-08-31 DIAGNOSIS — Z79899 Other long term (current) drug therapy: Secondary | ICD-10-CM | POA: Insufficient documentation

## 2023-08-31 DIAGNOSIS — E785 Hyperlipidemia, unspecified: Secondary | ICD-10-CM | POA: Insufficient documentation

## 2023-08-31 DIAGNOSIS — Z87442 Personal history of urinary calculi: Secondary | ICD-10-CM | POA: Insufficient documentation

## 2023-08-31 DIAGNOSIS — C61 Malignant neoplasm of prostate: Secondary | ICD-10-CM | POA: Diagnosis present

## 2023-08-31 DIAGNOSIS — E559 Vitamin D deficiency, unspecified: Secondary | ICD-10-CM | POA: Insufficient documentation

## 2023-08-31 HISTORY — DX: Elevated prostate specific antigen (PSA): R97.20

## 2023-08-31 HISTORY — DX: Male erectile dysfunction, unspecified: N52.9

## 2023-09-03 NOTE — Progress Notes (Signed)
 RN left message for call back to assess any barriers or questions prior to upcoming radiation treatment.

## 2023-09-08 ENCOUNTER — Telehealth: Payer: Self-pay

## 2023-09-08 NOTE — Telephone Encounter (Signed)
 RN spoke with William Cervantes about up coming appointment he was a little confused about his CT sim appointment on 09/27/2023.  He thought that was his start date and wanted to know why the next appointment was on 10/06/2023 it was explained and William Cervantes verbalized understanding at this time.  He was advised to call back if he had anymore questions.

## 2023-09-15 ENCOUNTER — Telehealth: Payer: Self-pay

## 2023-09-15 NOTE — Telephone Encounter (Signed)
 RN returned called to William Cervantes wanting letter to be excused from work while getting treatment.  Will prepare letter and put in the mail today.  William Cervantes was appreciative of assistance with this matter.

## 2023-09-24 ENCOUNTER — Telehealth: Payer: Self-pay | Admitting: *Deleted

## 2023-09-24 NOTE — Telephone Encounter (Signed)
 Called patient to remind of sim appt.for 09-27-23- arrival time- 10:45 am @ [facility], spoke with patient and he is aware of this appt.

## 2023-09-27 ENCOUNTER — Ambulatory Visit
Admission: RE | Admit: 2023-09-27 | Discharge: 2023-09-27 | Disposition: A | Discharge status: Home / Self Care | Source: Ambulatory Visit | Attending: Radiation Oncology | Admitting: Radiation Oncology

## 2023-09-27 DIAGNOSIS — C61 Malignant neoplasm of prostate: Secondary | ICD-10-CM | POA: Insufficient documentation

## 2023-09-27 NOTE — Progress Notes (Signed)
  Radiation Oncology         [phone] ________________________________  Name: William Cervantes MRN: [id number]  Date: 09/27/2023  DOB: 1961/01/09  SIMULATION AND TREATMENT PLANNING NOTE    ICD-10-CM   1. Prostate cancer [facility])  C61       DIAGNOSIS:   63 y.o. gentleman with a rising, detectable PSA of 0.11  s/p RALP 04/2022 for Stage pT3aN0, Gleason 4+5 prostate cancer   NARRATIVE:  The patient was brought to the CT Simulation planning suite.  Identity was confirmed.  All relevant records and images related to the planned course of therapy were reviewed.  The patient freely provided informed written consent to proceed with treatment after reviewing the details related to the planned course of therapy. The consent form was witnessed and verified by the simulation staff.  Then, the patient was set-up in a stable reproducible supine position for radiation therapy.  A vacuum lock pillow device was custom fabricated to position his legs in a reproducible immobilized position.  Then, I performed a urethrogram under sterile conditions to identify the prostatic apex.  CT images were obtained.  Surface markings were placed.  The CT images were loaded into the planning software.  Then the prostate target and avoidance structures including the rectum, bladder, bowel and hips were contoured.  Treatment planning then occurred.  The radiation prescription was entered and confirmed.  A total of one complex treatment devices was fabricated. I have requested : Intensity Modulated Radiotherapy (IMRT) is medically necessary for this case for the following reason:  Rectal sparing.Marland Kitchen  PLAN:   The prostate fossa and pelvic lymph nodes will initially be treated to 45 Gy in 25 fractions of 1.8 Gy followed by a boost to the fossa only, to 68.4 Gy with 13 additional fractions of 1.8 Gy   ________________________________  Artist Pais Kathrynn Running, M.D.

## 2023-09-28 DIAGNOSIS — C61 Malignant neoplasm of prostate: Secondary | ICD-10-CM | POA: Insufficient documentation

## 2023-10-06 ENCOUNTER — Other Ambulatory Visit: Payer: Self-pay

## 2023-10-06 ENCOUNTER — Ambulatory Visit
Admission: RE | Admit: 2023-10-06 | Discharge: 2023-10-06 | Disposition: A | Discharge status: Home / Self Care | Source: Ambulatory Visit | Attending: Radiation Oncology | Admitting: Radiation Oncology

## 2023-10-06 DIAGNOSIS — C61 Malignant neoplasm of prostate: Secondary | ICD-10-CM | POA: Diagnosis not present

## 2023-10-06 LAB — RAD ONC ARIA SESSION SUMMARY
Course Elapsed Days: 0
Plan Fractions Treated to Date: 1
Plan Prescribed Dose Per Fraction: 1.8 Gy
Plan Total Fractions Prescribed: 25
Plan Total Prescribed Dose: 45 Gy
Reference Point Dosage Given to Date: 1.8 Gy
Reference Point Session Dosage Given: 1.8 Gy
Session Number: 1

## 2023-10-07 ENCOUNTER — Ambulatory Visit
Admission: RE | Admit: 2023-10-07 | Discharge: 2023-10-07 | Disposition: A | Discharge status: Home / Self Care | Source: Ambulatory Visit | Attending: Radiation Oncology | Admitting: Radiation Oncology

## 2023-10-07 ENCOUNTER — Other Ambulatory Visit: Payer: Self-pay

## 2023-10-07 DIAGNOSIS — C61 Malignant neoplasm of prostate: Secondary | ICD-10-CM | POA: Diagnosis not present

## 2023-10-07 LAB — RAD ONC ARIA SESSION SUMMARY
Course Elapsed Days: 1
Plan Fractions Treated to Date: 2
Plan Prescribed Dose Per Fraction: 1.8 Gy
Plan Total Fractions Prescribed: 25
Plan Total Prescribed Dose: 45 Gy
Reference Point Dosage Given to Date: 3.6 Gy
Reference Point Session Dosage Given: 1.8 Gy
Session Number: 2

## 2023-10-08 ENCOUNTER — Ambulatory Visit
Admission: RE | Admit: 2023-10-08 | Discharge: 2023-10-08 | Disposition: A | Discharge status: Home / Self Care | Source: Ambulatory Visit | Attending: Radiation Oncology | Admitting: Radiation Oncology

## 2023-10-08 ENCOUNTER — Other Ambulatory Visit: Payer: Self-pay

## 2023-10-08 DIAGNOSIS — C61 Malignant neoplasm of prostate: Secondary | ICD-10-CM | POA: Diagnosis not present

## 2023-10-08 LAB — RAD ONC ARIA SESSION SUMMARY
Course Elapsed Days: 2
Plan Fractions Treated to Date: 3
Plan Prescribed Dose Per Fraction: 1.8 Gy
Plan Total Fractions Prescribed: 25
Plan Total Prescribed Dose: 45 Gy
Reference Point Dosage Given to Date: 5.4 Gy
Reference Point Session Dosage Given: 1.8 Gy
Session Number: 3

## 2023-10-11 ENCOUNTER — Other Ambulatory Visit: Payer: Self-pay

## 2023-10-11 ENCOUNTER — Ambulatory Visit
Admission: RE | Admit: 2023-10-11 | Discharge: 2023-10-11 | Disposition: A | Discharge status: Home / Self Care | Source: Ambulatory Visit | Attending: Radiation Oncology | Admitting: Radiation Oncology

## 2023-10-11 DIAGNOSIS — C61 Malignant neoplasm of prostate: Secondary | ICD-10-CM | POA: Diagnosis not present

## 2023-10-11 LAB — RAD ONC ARIA SESSION SUMMARY
Course Elapsed Days: 5
Plan Fractions Treated to Date: 4
Plan Prescribed Dose Per Fraction: 1.8 Gy
Plan Total Fractions Prescribed: 25
Plan Total Prescribed Dose: 45 Gy
Reference Point Dosage Given to Date: 7.2 Gy
Reference Point Session Dosage Given: 1.8 Gy
Session Number: 4

## 2023-10-12 ENCOUNTER — Ambulatory Visit
Admission: RE | Admit: 2023-10-12 | Discharge: 2023-10-12 | Disposition: A | Discharge status: Home / Self Care | Source: Ambulatory Visit | Attending: Radiation Oncology | Admitting: Radiation Oncology

## 2023-10-12 ENCOUNTER — Other Ambulatory Visit: Payer: Self-pay

## 2023-10-12 DIAGNOSIS — C61 Malignant neoplasm of prostate: Secondary | ICD-10-CM | POA: Diagnosis not present

## 2023-10-12 LAB — RAD ONC ARIA SESSION SUMMARY
Course Elapsed Days: 6
Plan Fractions Treated to Date: 5
Plan Prescribed Dose Per Fraction: 1.8 Gy
Plan Total Fractions Prescribed: 25
Plan Total Prescribed Dose: 45 Gy
Reference Point Dosage Given to Date: 9 Gy
Reference Point Session Dosage Given: 1.8 Gy
Session Number: 5

## 2023-10-13 ENCOUNTER — Ambulatory Visit
Admission: RE | Admit: 2023-10-13 | Discharge: 2023-10-13 | Disposition: A | Discharge status: Home / Self Care | Source: Ambulatory Visit | Attending: Radiation Oncology | Admitting: Radiation Oncology

## 2023-10-13 ENCOUNTER — Other Ambulatory Visit: Payer: Self-pay

## 2023-10-13 DIAGNOSIS — C61 Malignant neoplasm of prostate: Secondary | ICD-10-CM | POA: Diagnosis not present

## 2023-10-13 LAB — RAD ONC ARIA SESSION SUMMARY
Course Elapsed Days: 7
Plan Fractions Treated to Date: 6
Plan Prescribed Dose Per Fraction: 1.8 Gy
Plan Total Fractions Prescribed: 25
Plan Total Prescribed Dose: 45 Gy
Reference Point Dosage Given to Date: 10.8 Gy
Reference Point Session Dosage Given: 1.8 Gy
Session Number: 6

## 2023-10-14 ENCOUNTER — Other Ambulatory Visit: Payer: Self-pay

## 2023-10-14 ENCOUNTER — Ambulatory Visit
Admission: RE | Admit: 2023-10-14 | Discharge: 2023-10-14 | Disposition: A | Discharge status: Home / Self Care | Source: Ambulatory Visit | Attending: Radiation Oncology | Admitting: Radiation Oncology

## 2023-10-14 DIAGNOSIS — C61 Malignant neoplasm of prostate: Secondary | ICD-10-CM | POA: Diagnosis not present

## 2023-10-14 LAB — RAD ONC ARIA SESSION SUMMARY
Course Elapsed Days: 8
Plan Fractions Treated to Date: 7
Plan Prescribed Dose Per Fraction: 1.8 Gy
Plan Total Fractions Prescribed: 25
Plan Total Prescribed Dose: 45 Gy
Reference Point Dosage Given to Date: 12.6 Gy
Reference Point Session Dosage Given: 1.8 Gy
Session Number: 7

## 2023-10-15 ENCOUNTER — Other Ambulatory Visit: Payer: Self-pay

## 2023-10-15 ENCOUNTER — Ambulatory Visit
Admission: RE | Admit: 2023-10-15 | Discharge: 2023-10-15 | Disposition: A | Discharge status: Home / Self Care | Source: Ambulatory Visit | Attending: Radiation Oncology | Admitting: Radiation Oncology

## 2023-10-15 DIAGNOSIS — C61 Malignant neoplasm of prostate: Secondary | ICD-10-CM | POA: Diagnosis not present

## 2023-10-15 LAB — RAD ONC ARIA SESSION SUMMARY
Course Elapsed Days: 9
Plan Fractions Treated to Date: 8
Plan Prescribed Dose Per Fraction: 1.8 Gy
Plan Total Fractions Prescribed: 25
Plan Total Prescribed Dose: 45 Gy
Reference Point Dosage Given to Date: 14.4 Gy
Reference Point Session Dosage Given: 1.8 Gy
Session Number: 8

## 2023-10-18 ENCOUNTER — Ambulatory Visit
Admission: RE | Admit: 2023-10-18 | Discharge: 2023-10-18 | Disposition: A | Discharge status: Home / Self Care | Source: Ambulatory Visit | Attending: Radiation Oncology | Admitting: Radiation Oncology

## 2023-10-18 ENCOUNTER — Other Ambulatory Visit: Payer: Self-pay

## 2023-10-18 DIAGNOSIS — C61 Malignant neoplasm of prostate: Secondary | ICD-10-CM | POA: Diagnosis not present

## 2023-10-18 LAB — RAD ONC ARIA SESSION SUMMARY
Course Elapsed Days: 12
Plan Fractions Treated to Date: 9
Plan Prescribed Dose Per Fraction: 1.8 Gy
Plan Total Fractions Prescribed: 25
Plan Total Prescribed Dose: 45 Gy
Reference Point Dosage Given to Date: 16.2 Gy
Reference Point Session Dosage Given: 1.8 Gy
Session Number: 9

## 2023-10-19 ENCOUNTER — Other Ambulatory Visit: Payer: Self-pay

## 2023-10-19 ENCOUNTER — Ambulatory Visit
Admission: RE | Admit: 2023-10-19 | Discharge: 2023-10-19 | Disposition: A | Discharge status: Home / Self Care | Source: Ambulatory Visit | Attending: Radiation Oncology | Admitting: Radiation Oncology

## 2023-10-19 DIAGNOSIS — C61 Malignant neoplasm of prostate: Secondary | ICD-10-CM | POA: Diagnosis not present

## 2023-10-19 LAB — RAD ONC ARIA SESSION SUMMARY
Course Elapsed Days: 13
Plan Fractions Treated to Date: 10
Plan Prescribed Dose Per Fraction: 1.8 Gy
Plan Total Fractions Prescribed: 25
Plan Total Prescribed Dose: 45 Gy
Reference Point Dosage Given to Date: 18 Gy
Reference Point Session Dosage Given: 1.8 Gy
Session Number: 10

## 2023-10-20 ENCOUNTER — Other Ambulatory Visit: Payer: Self-pay

## 2023-10-20 ENCOUNTER — Ambulatory Visit
Admission: RE | Admit: 2023-10-20 | Discharge: 2023-10-20 | Disposition: A | Discharge status: Home / Self Care | Source: Ambulatory Visit | Attending: Radiation Oncology

## 2023-10-20 DIAGNOSIS — C61 Malignant neoplasm of prostate: Secondary | ICD-10-CM | POA: Diagnosis not present

## 2023-10-20 LAB — RAD ONC ARIA SESSION SUMMARY
Course Elapsed Days: 14
Plan Fractions Treated to Date: 11
Plan Prescribed Dose Per Fraction: 1.8 Gy
Plan Total Fractions Prescribed: 25
Plan Total Prescribed Dose: 45 Gy
Reference Point Dosage Given to Date: 19.8 Gy
Reference Point Session Dosage Given: 1.8 Gy
Session Number: 11

## 2023-10-21 ENCOUNTER — Ambulatory Visit

## 2023-10-21 ENCOUNTER — Other Ambulatory Visit: Payer: Self-pay

## 2023-10-21 ENCOUNTER — Ambulatory Visit
Admission: RE | Admit: 2023-10-21 | Discharge: 2023-10-21 | Disposition: A | Discharge status: Home / Self Care | Source: Ambulatory Visit | Attending: Radiation Oncology | Admitting: Radiation Oncology

## 2023-10-21 DIAGNOSIS — C61 Malignant neoplasm of prostate: Secondary | ICD-10-CM | POA: Diagnosis not present

## 2023-10-21 LAB — RAD ONC ARIA SESSION SUMMARY
Course Elapsed Days: 15
Plan Fractions Treated to Date: 12
Plan Prescribed Dose Per Fraction: 1.8 Gy
Plan Total Fractions Prescribed: 25
Plan Total Prescribed Dose: 45 Gy
Reference Point Dosage Given to Date: 21.6 Gy
Reference Point Session Dosage Given: 1.8 Gy
Session Number: 12

## 2023-10-22 ENCOUNTER — Ambulatory Visit
Admission: RE | Admit: 2023-10-22 | Discharge: 2023-10-22 | Disposition: A | Discharge status: Home / Self Care | Source: Ambulatory Visit | Attending: Radiation Oncology | Admitting: Radiation Oncology

## 2023-10-22 ENCOUNTER — Other Ambulatory Visit: Payer: Self-pay

## 2023-10-22 DIAGNOSIS — C61 Malignant neoplasm of prostate: Secondary | ICD-10-CM | POA: Diagnosis not present

## 2023-10-22 LAB — RAD ONC ARIA SESSION SUMMARY
Course Elapsed Days: 16
Plan Fractions Treated to Date: 13
Plan Prescribed Dose Per Fraction: 1.8 Gy
Plan Total Fractions Prescribed: 25
Plan Total Prescribed Dose: 45 Gy
Reference Point Dosage Given to Date: 23.4 Gy
Reference Point Session Dosage Given: 1.8 Gy
Session Number: 13

## 2023-10-25 ENCOUNTER — Ambulatory Visit
Admission: RE | Admit: 2023-10-25 | Discharge: 2023-10-25 | Disposition: A | Discharge status: Home / Self Care | Source: Ambulatory Visit | Attending: Radiation Oncology

## 2023-10-25 ENCOUNTER — Other Ambulatory Visit: Payer: Self-pay

## 2023-10-25 DIAGNOSIS — C61 Malignant neoplasm of prostate: Secondary | ICD-10-CM | POA: Diagnosis not present

## 2023-10-25 LAB — RAD ONC ARIA SESSION SUMMARY
Course Elapsed Days: 19
Plan Fractions Treated to Date: 14
Plan Prescribed Dose Per Fraction: 1.8 Gy
Plan Total Fractions Prescribed: 25
Plan Total Prescribed Dose: 45 Gy
Reference Point Dosage Given to Date: 25.2 Gy
Reference Point Session Dosage Given: 1.8 Gy
Session Number: 14

## 2023-10-26 ENCOUNTER — Other Ambulatory Visit: Payer: Self-pay

## 2023-10-26 ENCOUNTER — Ambulatory Visit
Admission: RE | Admit: 2023-10-26 | Discharge: 2023-10-26 | Disposition: A | Discharge status: Home / Self Care | Source: Ambulatory Visit | Attending: Radiation Oncology

## 2023-10-26 DIAGNOSIS — C61 Malignant neoplasm of prostate: Secondary | ICD-10-CM | POA: Diagnosis not present

## 2023-10-26 LAB — RAD ONC ARIA SESSION SUMMARY
Course Elapsed Days: 20
Plan Fractions Treated to Date: 15
Plan Prescribed Dose Per Fraction: 1.8 Gy
Plan Total Fractions Prescribed: 25
Plan Total Prescribed Dose: 45 Gy
Reference Point Dosage Given to Date: 27 Gy
Reference Point Session Dosage Given: 1.8 Gy
Session Number: 15

## 2023-10-27 ENCOUNTER — Ambulatory Visit
Admission: RE | Admit: 2023-10-27 | Discharge: 2023-10-27 | Disposition: A | Discharge status: Home / Self Care | Source: Ambulatory Visit | Attending: Radiation Oncology

## 2023-10-27 ENCOUNTER — Other Ambulatory Visit: Payer: Self-pay

## 2023-10-27 DIAGNOSIS — C61 Malignant neoplasm of prostate: Secondary | ICD-10-CM | POA: Diagnosis not present

## 2023-10-27 LAB — RAD ONC ARIA SESSION SUMMARY
Course Elapsed Days: 21
Plan Fractions Treated to Date: 16
Plan Prescribed Dose Per Fraction: 1.8 Gy
Plan Total Fractions Prescribed: 25
Plan Total Prescribed Dose: 45 Gy
Reference Point Dosage Given to Date: 28.8 Gy
Reference Point Session Dosage Given: 1.8 Gy
Session Number: 16

## 2023-10-28 ENCOUNTER — Other Ambulatory Visit: Payer: Self-pay

## 2023-10-28 ENCOUNTER — Ambulatory Visit
Admission: RE | Admit: 2023-10-28 | Discharge: 2023-10-28 | Disposition: A | Discharge status: Home / Self Care | Source: Ambulatory Visit | Attending: Radiation Oncology | Admitting: Radiation Oncology

## 2023-10-28 DIAGNOSIS — C61 Malignant neoplasm of prostate: Secondary | ICD-10-CM | POA: Insufficient documentation

## 2023-10-28 DIAGNOSIS — Z510 Encounter for antineoplastic radiation therapy: Secondary | ICD-10-CM | POA: Diagnosis not present

## 2023-10-28 LAB — RAD ONC ARIA SESSION SUMMARY
Course Elapsed Days: 22
Plan Fractions Treated to Date: 17
Plan Prescribed Dose Per Fraction: 1.8 Gy
Plan Total Fractions Prescribed: 25
Plan Total Prescribed Dose: 45 Gy
Reference Point Dosage Given to Date: 30.6 Gy
Reference Point Session Dosage Given: 1.8 Gy
Session Number: 17

## 2023-10-29 ENCOUNTER — Other Ambulatory Visit: Payer: Self-pay

## 2023-10-29 ENCOUNTER — Ambulatory Visit
Admission: RE | Admit: 2023-10-29 | Discharge: 2023-10-29 | Disposition: A | Discharge status: Home / Self Care | Source: Ambulatory Visit | Attending: Radiation Oncology

## 2023-10-29 DIAGNOSIS — Z510 Encounter for antineoplastic radiation therapy: Secondary | ICD-10-CM | POA: Diagnosis not present

## 2023-10-29 LAB — RAD ONC ARIA SESSION SUMMARY
Course Elapsed Days: 23
Plan Fractions Treated to Date: 18
Plan Prescribed Dose Per Fraction: 1.8 Gy
Plan Total Fractions Prescribed: 25
Plan Total Prescribed Dose: 45 Gy
Reference Point Dosage Given to Date: 32.4 Gy
Reference Point Session Dosage Given: 1.8 Gy
Session Number: 18

## 2023-11-01 ENCOUNTER — Other Ambulatory Visit: Payer: Self-pay

## 2023-11-01 ENCOUNTER — Ambulatory Visit
Admission: RE | Admit: 2023-11-01 | Discharge: 2023-11-01 | Disposition: A | Discharge status: Home / Self Care | Source: Ambulatory Visit | Attending: Radiation Oncology

## 2023-11-01 DIAGNOSIS — Z510 Encounter for antineoplastic radiation therapy: Secondary | ICD-10-CM | POA: Diagnosis not present

## 2023-11-01 LAB — RAD ONC ARIA SESSION SUMMARY
Course Elapsed Days: 26
Plan Fractions Treated to Date: 19
Plan Prescribed Dose Per Fraction: 1.8 Gy
Plan Total Fractions Prescribed: 25
Plan Total Prescribed Dose: 45 Gy
Reference Point Dosage Given to Date: 34.2 Gy
Reference Point Session Dosage Given: 1.8 Gy
Session Number: 19

## 2023-11-02 ENCOUNTER — Other Ambulatory Visit: Payer: Self-pay

## 2023-11-02 ENCOUNTER — Ambulatory Visit
Admission: RE | Admit: 2023-11-02 | Discharge: 2023-11-02 | Disposition: A | Discharge status: Home / Self Care | Source: Ambulatory Visit | Attending: Radiation Oncology

## 2023-11-02 DIAGNOSIS — Z510 Encounter for antineoplastic radiation therapy: Secondary | ICD-10-CM | POA: Diagnosis not present

## 2023-11-02 LAB — RAD ONC ARIA SESSION SUMMARY
Course Elapsed Days: 27
Plan Fractions Treated to Date: 20
Plan Prescribed Dose Per Fraction: 1.8 Gy
Plan Total Fractions Prescribed: 25
Plan Total Prescribed Dose: 45 Gy
Reference Point Dosage Given to Date: 36 Gy
Reference Point Session Dosage Given: 1.8 Gy
Session Number: 20

## 2023-11-03 ENCOUNTER — Ambulatory Visit
Admission: RE | Admit: 2023-11-03 | Discharge: 2023-11-03 | Disposition: A | Discharge status: Home / Self Care | Source: Ambulatory Visit | Attending: Radiation Oncology

## 2023-11-03 ENCOUNTER — Other Ambulatory Visit: Payer: Self-pay

## 2023-11-03 DIAGNOSIS — Z510 Encounter for antineoplastic radiation therapy: Secondary | ICD-10-CM | POA: Diagnosis not present

## 2023-11-03 LAB — RAD ONC ARIA SESSION SUMMARY
Course Elapsed Days: 28
Plan Fractions Treated to Date: 21
Plan Prescribed Dose Per Fraction: 1.8 Gy
Plan Total Fractions Prescribed: 25
Plan Total Prescribed Dose: 45 Gy
Reference Point Dosage Given to Date: 37.8 Gy
Reference Point Session Dosage Given: 1.8 Gy
Session Number: 21

## 2023-11-04 ENCOUNTER — Other Ambulatory Visit: Payer: Self-pay

## 2023-11-04 ENCOUNTER — Ambulatory Visit
Admission: RE | Admit: 2023-11-04 | Discharge: 2023-11-04 | Disposition: A | Discharge status: Home / Self Care | Source: Ambulatory Visit | Attending: Radiation Oncology | Admitting: Radiation Oncology

## 2023-11-04 DIAGNOSIS — Z510 Encounter for antineoplastic radiation therapy: Secondary | ICD-10-CM | POA: Diagnosis not present

## 2023-11-04 LAB — RAD ONC ARIA SESSION SUMMARY
Course Elapsed Days: 29
Plan Fractions Treated to Date: 22
Plan Prescribed Dose Per Fraction: 1.8 Gy
Plan Total Fractions Prescribed: 25
Plan Total Prescribed Dose: 45 Gy
Reference Point Dosage Given to Date: 39.6 Gy
Reference Point Session Dosage Given: 1.8 Gy
Session Number: 22

## 2023-11-05 ENCOUNTER — Ambulatory Visit
Admission: RE | Admit: 2023-11-05 | Discharge: 2023-11-05 | Disposition: A | Discharge status: Home / Self Care | Source: Ambulatory Visit | Attending: Radiation Oncology

## 2023-11-05 ENCOUNTER — Other Ambulatory Visit: Payer: Self-pay

## 2023-11-05 DIAGNOSIS — Z510 Encounter for antineoplastic radiation therapy: Secondary | ICD-10-CM | POA: Diagnosis not present

## 2023-11-05 LAB — RAD ONC ARIA SESSION SUMMARY
Course Elapsed Days: 30
Plan Fractions Treated to Date: 23
Plan Prescribed Dose Per Fraction: 1.8 Gy
Plan Total Fractions Prescribed: 25
Plan Total Prescribed Dose: 45 Gy
Reference Point Dosage Given to Date: 41.4 Gy
Reference Point Session Dosage Given: 1.8 Gy
Session Number: 23

## 2023-11-08 ENCOUNTER — Ambulatory Visit
Admission: RE | Admit: 2023-11-08 | Discharge: 2023-11-08 | Disposition: A | Discharge status: Home / Self Care | Source: Ambulatory Visit | Attending: Radiation Oncology | Admitting: Radiation Oncology

## 2023-11-08 ENCOUNTER — Other Ambulatory Visit: Payer: Self-pay

## 2023-11-08 DIAGNOSIS — Z510 Encounter for antineoplastic radiation therapy: Secondary | ICD-10-CM | POA: Diagnosis not present

## 2023-11-08 LAB — RAD ONC ARIA SESSION SUMMARY
Course Elapsed Days: 33
Plan Fractions Treated to Date: 24
Plan Prescribed Dose Per Fraction: 1.8 Gy
Plan Total Fractions Prescribed: 25
Plan Total Prescribed Dose: 45 Gy
Reference Point Dosage Given to Date: 43.2 Gy
Reference Point Session Dosage Given: 1.8 Gy
Session Number: 24

## 2023-11-09 ENCOUNTER — Other Ambulatory Visit: Payer: Self-pay

## 2023-11-09 ENCOUNTER — Ambulatory Visit
Admission: RE | Admit: 2023-11-09 | Discharge: 2023-11-09 | Disposition: A | Discharge status: Home / Self Care | Source: Ambulatory Visit | Attending: Radiation Oncology | Admitting: Radiation Oncology

## 2023-11-09 DIAGNOSIS — Z510 Encounter for antineoplastic radiation therapy: Secondary | ICD-10-CM | POA: Diagnosis not present

## 2023-11-09 LAB — RAD ONC ARIA SESSION SUMMARY
Course Elapsed Days: 34
Plan Fractions Treated to Date: 25
Plan Prescribed Dose Per Fraction: 1.8 Gy
Plan Total Fractions Prescribed: 25
Plan Total Prescribed Dose: 45 Gy
Reference Point Dosage Given to Date: 45 Gy
Reference Point Session Dosage Given: 1.8 Gy
Session Number: 25

## 2023-11-10 ENCOUNTER — Other Ambulatory Visit: Payer: Self-pay

## 2023-11-10 ENCOUNTER — Ambulatory Visit
Admission: RE | Admit: 2023-11-10 | Discharge: 2023-11-10 | Disposition: A | Discharge status: Home / Self Care | Source: Ambulatory Visit | Attending: Radiation Oncology | Admitting: Radiation Oncology

## 2023-11-10 DIAGNOSIS — Z510 Encounter for antineoplastic radiation therapy: Secondary | ICD-10-CM | POA: Diagnosis not present

## 2023-11-10 LAB — RAD ONC ARIA SESSION SUMMARY
Course Elapsed Days: 35
Plan Fractions Treated to Date: 1
Plan Prescribed Dose Per Fraction: 1.8 Gy
Plan Total Fractions Prescribed: 13
Plan Total Prescribed Dose: 23.4 Gy
Reference Point Dosage Given to Date: 1.8 Gy
Reference Point Session Dosage Given: 1.8 Gy
Session Number: 26

## 2023-11-11 ENCOUNTER — Other Ambulatory Visit: Payer: Self-pay

## 2023-11-11 ENCOUNTER — Ambulatory Visit
Admission: RE | Admit: 2023-11-11 | Discharge: 2023-11-11 | Disposition: A | Discharge status: Home / Self Care | Source: Ambulatory Visit | Attending: Radiation Oncology | Admitting: Radiation Oncology

## 2023-11-11 DIAGNOSIS — Z510 Encounter for antineoplastic radiation therapy: Secondary | ICD-10-CM | POA: Diagnosis not present

## 2023-11-11 LAB — RAD ONC ARIA SESSION SUMMARY
Course Elapsed Days: 36
Plan Fractions Treated to Date: 2
Plan Prescribed Dose Per Fraction: 1.8 Gy
Plan Total Fractions Prescribed: 13
Plan Total Prescribed Dose: 23.4 Gy
Reference Point Dosage Given to Date: 3.6 Gy
Reference Point Session Dosage Given: 1.8 Gy
Session Number: 27

## 2023-11-12 ENCOUNTER — Ambulatory Visit
Admission: RE | Admit: 2023-11-12 | Discharge: 2023-11-12 | Disposition: A | Discharge status: Home / Self Care | Source: Ambulatory Visit | Attending: Radiation Oncology | Admitting: Radiation Oncology

## 2023-11-12 ENCOUNTER — Other Ambulatory Visit: Payer: Self-pay

## 2023-11-12 DIAGNOSIS — Z510 Encounter for antineoplastic radiation therapy: Secondary | ICD-10-CM | POA: Diagnosis not present

## 2023-11-12 LAB — RAD ONC ARIA SESSION SUMMARY
Course Elapsed Days: 37
Plan Fractions Treated to Date: 3
Plan Prescribed Dose Per Fraction: 1.8 Gy
Plan Total Fractions Prescribed: 13
Plan Total Prescribed Dose: 23.4 Gy
Reference Point Dosage Given to Date: 5.4 Gy
Reference Point Session Dosage Given: 1.8 Gy
Session Number: 28

## 2023-11-15 ENCOUNTER — Other Ambulatory Visit: Payer: Self-pay

## 2023-11-15 ENCOUNTER — Ambulatory Visit
Admission: RE | Admit: 2023-11-15 | Discharge: 2023-11-15 | Disposition: A | Discharge status: Home / Self Care | Source: Ambulatory Visit | Attending: Radiation Oncology | Admitting: Radiation Oncology

## 2023-11-15 DIAGNOSIS — Z510 Encounter for antineoplastic radiation therapy: Secondary | ICD-10-CM | POA: Diagnosis not present

## 2023-11-15 LAB — RAD ONC ARIA SESSION SUMMARY
Course Elapsed Days: 40
Plan Fractions Treated to Date: 4
Plan Prescribed Dose Per Fraction: 1.8 Gy
Plan Total Fractions Prescribed: 13
Plan Total Prescribed Dose: 23.4 Gy
Reference Point Dosage Given to Date: 7.2 Gy
Reference Point Session Dosage Given: 1.8 Gy
Session Number: 29

## 2023-11-15 MED ORDER — ONDANSETRON HCL 8 MG PO TABS: 8.0000 mg | ORAL_TABLET | Freq: Three times a day (TID) | ORAL | 2 refills | Status: DC | PRN

## 2023-11-15 NOTE — Progress Notes (Signed)
 William Cervantes called in this morning with complaints of nausea started Friday after radiation treatment and on through the weekend.  He has been drinking ginger ale which was helping but now he feels like he may need Zofran  medication.  Per Dr. Lorri Rota this writer called in order Zofran  8 mg po q8h, prn for nausea; dispense 15 an 2 refills to [facility] in [place] as requested by William Cervantes.

## 2023-11-16 ENCOUNTER — Other Ambulatory Visit: Payer: Self-pay

## 2023-11-16 ENCOUNTER — Ambulatory Visit
Admission: RE | Admit: 2023-11-16 | Discharge: 2023-11-16 | Disposition: A | Discharge status: Home / Self Care | Source: Ambulatory Visit | Attending: Radiation Oncology | Admitting: Radiation Oncology

## 2023-11-16 DIAGNOSIS — Z510 Encounter for antineoplastic radiation therapy: Secondary | ICD-10-CM | POA: Diagnosis not present

## 2023-11-16 LAB — RAD ONC ARIA SESSION SUMMARY
Course Elapsed Days: 41
Plan Fractions Treated to Date: 5
Plan Prescribed Dose Per Fraction: 1.8 Gy
Plan Total Fractions Prescribed: 13
Plan Total Prescribed Dose: 23.4 Gy
Reference Point Dosage Given to Date: 9 Gy
Reference Point Session Dosage Given: 1.8 Gy
Session Number: 30

## 2023-11-17 ENCOUNTER — Ambulatory Visit
Admission: RE | Admit: 2023-11-17 | Discharge: 2023-11-17 | Disposition: A | Discharge status: Home / Self Care | Source: Ambulatory Visit | Attending: Radiation Oncology | Admitting: Radiation Oncology

## 2023-11-17 ENCOUNTER — Other Ambulatory Visit: Payer: Self-pay

## 2023-11-17 DIAGNOSIS — Z510 Encounter for antineoplastic radiation therapy: Secondary | ICD-10-CM | POA: Diagnosis not present

## 2023-11-17 LAB — RAD ONC ARIA SESSION SUMMARY
Course Elapsed Days: 42
Plan Fractions Treated to Date: 6
Plan Prescribed Dose Per Fraction: 1.8 Gy
Plan Total Fractions Prescribed: 13
Plan Total Prescribed Dose: 23.4 Gy
Reference Point Dosage Given to Date: 10.8 Gy
Reference Point Session Dosage Given: 1.8 Gy
Session Number: 31

## 2023-11-18 ENCOUNTER — Ambulatory Visit
Admission: RE | Admit: 2023-11-18 | Discharge: 2023-11-18 | Disposition: A | Discharge status: Home / Self Care | Source: Ambulatory Visit | Attending: Radiation Oncology | Admitting: Radiation Oncology

## 2023-11-18 ENCOUNTER — Other Ambulatory Visit: Payer: Self-pay

## 2023-11-18 DIAGNOSIS — Z510 Encounter for antineoplastic radiation therapy: Secondary | ICD-10-CM | POA: Diagnosis not present

## 2023-11-18 LAB — RAD ONC ARIA SESSION SUMMARY
Course Elapsed Days: 43
Plan Fractions Treated to Date: 7
Plan Prescribed Dose Per Fraction: 1.8 Gy
Plan Total Fractions Prescribed: 13
Plan Total Prescribed Dose: 23.4 Gy
Reference Point Dosage Given to Date: 12.6 Gy
Reference Point Session Dosage Given: 1.8 Gy
Session Number: 32

## 2023-11-19 ENCOUNTER — Other Ambulatory Visit: Payer: Self-pay

## 2023-11-19 ENCOUNTER — Ambulatory Visit
Admission: RE | Admit: 2023-11-19 | Discharge: 2023-11-19 | Disposition: A | Discharge status: Home / Self Care | Source: Ambulatory Visit | Attending: Radiation Oncology | Admitting: Radiation Oncology

## 2023-11-19 DIAGNOSIS — Z510 Encounter for antineoplastic radiation therapy: Secondary | ICD-10-CM | POA: Diagnosis not present

## 2023-11-19 LAB — RAD ONC ARIA SESSION SUMMARY
Course Elapsed Days: 44
Plan Fractions Treated to Date: 8
Plan Prescribed Dose Per Fraction: 1.8 Gy
Plan Total Fractions Prescribed: 13
Plan Total Prescribed Dose: 23.4 Gy
Reference Point Dosage Given to Date: 14.4 Gy
Reference Point Session Dosage Given: 1.8 Gy
Session Number: 33

## 2023-11-23 ENCOUNTER — Other Ambulatory Visit: Payer: Self-pay

## 2023-11-23 ENCOUNTER — Ambulatory Visit
Admission: RE | Admit: 2023-11-23 | Discharge: 2023-11-23 | Disposition: A | Discharge status: Home / Self Care | Source: Ambulatory Visit | Attending: Radiation Oncology

## 2023-11-23 DIAGNOSIS — Z510 Encounter for antineoplastic radiation therapy: Secondary | ICD-10-CM | POA: Diagnosis not present

## 2023-11-23 LAB — RAD ONC ARIA SESSION SUMMARY
Course Elapsed Days: 48
Plan Fractions Treated to Date: 9
Plan Prescribed Dose Per Fraction: 1.8 Gy
Plan Total Fractions Prescribed: 13
Plan Total Prescribed Dose: 23.4 Gy
Reference Point Dosage Given to Date: 16.2 Gy
Reference Point Session Dosage Given: 1.8 Gy
Session Number: 34

## 2023-11-24 ENCOUNTER — Other Ambulatory Visit: Payer: Self-pay

## 2023-11-24 ENCOUNTER — Ambulatory Visit
Admission: RE | Admit: 2023-11-24 | Discharge: 2023-11-24 | Disposition: A | Discharge status: Home / Self Care | Source: Ambulatory Visit | Attending: Radiation Oncology

## 2023-11-24 DIAGNOSIS — Z510 Encounter for antineoplastic radiation therapy: Secondary | ICD-10-CM | POA: Diagnosis not present

## 2023-11-24 LAB — RAD ONC ARIA SESSION SUMMARY
Course Elapsed Days: 49
Plan Fractions Treated to Date: 10
Plan Prescribed Dose Per Fraction: 1.8 Gy
Plan Total Fractions Prescribed: 13
Plan Total Prescribed Dose: 23.4 Gy
Reference Point Dosage Given to Date: 18 Gy
Reference Point Session Dosage Given: 1.8 Gy
Session Number: 35

## 2023-11-25 ENCOUNTER — Other Ambulatory Visit: Payer: Self-pay

## 2023-11-25 ENCOUNTER — Ambulatory Visit
Admission: RE | Admit: 2023-11-25 | Discharge: 2023-11-25 | Disposition: A | Discharge status: Home / Self Care | Source: Ambulatory Visit | Attending: Radiation Oncology

## 2023-11-25 DIAGNOSIS — Z510 Encounter for antineoplastic radiation therapy: Secondary | ICD-10-CM | POA: Diagnosis not present

## 2023-11-25 LAB — RAD ONC ARIA SESSION SUMMARY
Course Elapsed Days: 50
Plan Fractions Treated to Date: 11
Plan Prescribed Dose Per Fraction: 1.8 Gy
Plan Total Fractions Prescribed: 13
Plan Total Prescribed Dose: 23.4 Gy
Reference Point Dosage Given to Date: 19.8 Gy
Reference Point Session Dosage Given: 1.8 Gy
Session Number: 36

## 2023-11-26 ENCOUNTER — Ambulatory Visit
Admission: RE | Admit: 2023-11-26 | Discharge: 2023-11-26 | Disposition: A | Discharge status: Home / Self Care | Source: Ambulatory Visit | Attending: Radiation Oncology | Admitting: Radiation Oncology

## 2023-11-26 ENCOUNTER — Other Ambulatory Visit: Payer: Self-pay

## 2023-11-26 DIAGNOSIS — Z510 Encounter for antineoplastic radiation therapy: Secondary | ICD-10-CM | POA: Diagnosis not present

## 2023-11-26 LAB — RAD ONC ARIA SESSION SUMMARY
Course Elapsed Days: 51
Plan Fractions Treated to Date: 12
Plan Prescribed Dose Per Fraction: 1.8 Gy
Plan Total Fractions Prescribed: 13
Plan Total Prescribed Dose: 23.4 Gy
Reference Point Dosage Given to Date: 21.6 Gy
Reference Point Session Dosage Given: 1.8 Gy
Session Number: 37

## 2023-11-29 ENCOUNTER — Ambulatory Visit
Admission: RE | Admit: 2023-11-29 | Discharge: 2023-11-29 | Disposition: A | Discharge status: Home / Self Care | Source: Ambulatory Visit | Attending: Radiation Oncology | Admitting: Radiation Oncology

## 2023-11-29 ENCOUNTER — Other Ambulatory Visit: Payer: Self-pay

## 2023-11-29 DIAGNOSIS — C61 Malignant neoplasm of prostate: Secondary | ICD-10-CM | POA: Insufficient documentation

## 2023-11-29 LAB — RAD ONC ARIA SESSION SUMMARY
Course Elapsed Days: 54
Plan Fractions Treated to Date: 13
Plan Prescribed Dose Per Fraction: 1.8 Gy
Plan Total Fractions Prescribed: 13
Plan Total Prescribed Dose: 23.4 Gy
Reference Point Dosage Given to Date: 23.4 Gy
Reference Point Session Dosage Given: 1.8 Gy
Session Number: 38

## 2023-11-30 NOTE — Radiation Completion Notes (Addendum)
  Radiation Oncology         [phone] ________________________________  Name: William Cervantes MRN: [id number]  Date: 11/29/2023  DOB: 23-Sep-1960  Referring Physician: Osborn Blaze, M.D. Date of Service: 2023-11-30 Radiation Oncologist: Bartholome Ligas, M.D. [facility]     RADIATION ONCOLOGY END OF TREATMENT NOTE     Diagnosis:  63 y.o. gentleman with a rising, detectable PSA of 0.11  s/p RALP 04/2022 for Stage pT3aN0, Gleason 4+5 prostate cancer   Intent: Curative     ==========DELIVERED PLANS==========  First Treatment Date: 2023-10-06 Last Treatment Date: 2023-11-29   Plan Name: ProstBed_Pelv Site: Prostate Bed Technique: IMRT Mode: Photon Dose Per Fraction: 1.8 Gy Prescribed Dose (Delivered / Prescribed): 45 Gy / 45 Gy Prescribed Fxs (Delivered / Prescribed): 25 / 25   Plan Name: ProstBed_Bst Site: Prostate Bed Technique: IMRT Mode: Photon Dose Per Fraction: 1.8 Gy Prescribed Dose (Delivered / Prescribed): 23.4 Gy / 23.4 Gy Prescribed Fxs (Delivered / Prescribed): 13 / 13     ==========ON TREATMENT VISIT DATES========== 2023-10-08, 2023-10-15, 2023-10-22, 2023-10-29, 2023-11-05, 2023-11-12, 2023-11-19, 2023-11-26   See weekly On Treatment Notes in Epic for details in the Media tab (listed as Progress notes on the On Treatment Visit Dates listed above).  He tolerated the treatments relatively well with mild increased LUTS and modest fatigue.  The patient will receive a call in about one month from the radiation oncology department. He will continue follow up with his urologist, Dr. Secundino Dach, as well.  ------------------------------------------------   [name], MD [facility]  Radiation Oncology Direct Dial: [phone]  Fax: [phone] [email or web address]  Skype  LinkedIn

## 2023-12-03 NOTE — Progress Notes (Signed)
 Patient was a RadOnc Consult on 08/31/23 for his rising, detectable PSA of 0.11  s/p RALP.  Patient proceed with treatment recommendations of 7.5 week course of daily external beam therapy and had his final radiation treatment on 11/29/23.   Patient is scheduled for a post treatment nurse call on 12/28/23 and has his first post treatment PSA on 01/24/24 at [facility].

## 2023-12-10 ENCOUNTER — Other Ambulatory Visit: Payer: Self-pay | Admitting: Urology

## 2023-12-10 DIAGNOSIS — C61 Malignant neoplasm of prostate: Secondary | ICD-10-CM

## 2023-12-23 NOTE — Progress Notes (Addendum)
  Radiation Oncology         [phone] ________________________________  Name: William Cervantes MRN: [id number]  Date of Service: 12/28/2023  DOB: [birth year]  Post Treatment Telephone Note  Diagnosis: 63 y.o. gentleman with a rising, detectable PSA of 0.11  s/p RALP 04/2022 for Stage pT3aN0, Gleason 4+5 prostate cancer.   Pre Treatment IPSS Score: 4 (as documented in the provider consult note)  The patient was available for call today.   Symptoms of fatigue have improved since completing therapy.  Symptoms of bladder changes have improved since completing therapy. Current symptoms include polyuria (improving), and medications for bladder symptoms include none.  Symptoms of bowel changes have improved since completing therapy. Current symptoms include soft stools (improving), and medications for bowel symptoms include none.   Post Treatment IPSS Score: IPSS Questionnaire (AUA-7): Over the past month.   1)  How often have you had a sensation of not emptying your bladder completely after you finish urinating?  0 - Not at all  2)  How often have you had to urinate again less than two hours after you finished urinating? 4 - More than half the time  3)  How often have you found you stopped and started again several times when you urinated?  3 - About half the time  4) How difficult have you found it to postpone urination?  3 - About half the time  5) How often have you had a weak urinary stream?  4 - More than half the time  6) How often have you had to push or strain to begin urination?  0 - Not at all  7) How many times did you most typically get up to urinate from the time you went to bed until the time you got up in the morning?  4 - 4 times  Total score:  18. Which indicates moderate symptoms  0-7 mildly symptomatic   8-19 moderately symptomatic   20-35 severely symptomatic   Patient has a scheduled follow up visit with his urologist, Dr. Alvaro, on 01/2024 for ongoing surveillance.  He was counseled that PSA levels will be drawn in the urology office, and was reassured that additional time is expected to improve bowel and bladder symptoms. He was encouraged to call back with concerns or questions regarding radiation.   This concludes the interaction.  Rosaline Minerva, LPN

## 2023-12-28 ENCOUNTER — Ambulatory Visit
Admission: RE | Admit: 2023-12-28 | Discharge: 2023-12-28 | Disposition: A | Discharge status: Home / Self Care | Source: Ambulatory Visit | Attending: Internal Medicine | Admitting: Internal Medicine

## 2024-02-01 ENCOUNTER — Encounter (HOSPITAL_BASED_OUTPATIENT_CLINIC_OR_DEPARTMENT_OTHER): Payer: Self-pay

## 2024-02-01 DIAGNOSIS — R0683 Snoring: Secondary | ICD-10-CM

## 2024-02-02 ENCOUNTER — Ambulatory Visit (HOSPITAL_BASED_OUTPATIENT_CLINIC_OR_DEPARTMENT_OTHER): Attending: Internal Medicine | Admitting: Internal Medicine

## 2024-02-02 ENCOUNTER — Encounter (HOSPITAL_BASED_OUTPATIENT_CLINIC_OR_DEPARTMENT_OTHER): Payer: Self-pay | Admitting: Radiology

## 2024-02-02 DIAGNOSIS — R0683 Snoring: Secondary | ICD-10-CM

## 2024-02-02 DIAGNOSIS — G4733 Obstructive sleep apnea (adult) (pediatric): Secondary | ICD-10-CM | POA: Diagnosis not present

## 2024-02-03 ENCOUNTER — Encounter (HOSPITAL_BASED_OUTPATIENT_CLINIC_OR_DEPARTMENT_OTHER): Payer: Self-pay | Admitting: Radiology

## 2024-02-03 ENCOUNTER — Encounter: Payer: Self-pay | Admitting: *Deleted

## 2024-02-13 NOTE — Procedures (Signed)
 [facility] [address] [place], [place] [address] Tel: [phone]   Fax: [phone]  Home Sleep Test Interpretation  Patient Name: William Cervantes, William Cervantes Study Date: 02/02/2024  Date of Birth: 1960/07/21 Study Type: HST  Age: 63 year MRN #: [id number]  Sex: Male Interpreting Physician: NEYSA RAMA, [id number]  Height: 5' 9 Referring Physician: TANDA RUMMER [phone])  Weight: 263.0 lbs Recording Tech: Will Poet RRT RPSGT RST  BMI: 39.0 Scoring Tech: Will Poet RRT RPSGT RST  ESS: 0 Neck Size: 20  %%star  Indications for Polysomnography The patient is a 63 year old Male who is 5' 9 and weighs 263.0 lbs. His BMI equals 39.0.  A home sleep apnea test was performed to evaluate for -OSA  Medication  No Data.   Polysomnogram Data A home sleep test recorded the standard physiologic parameters including EKG, nasal and oral airflow.  Respiratory parameters of chest and abdominal movements were recorded with Respiratory Inductance Plethysmography belts.  Oxygen saturation was recorded by pulse oximetry.   Study Architecture The total recording time of the polysomnogram was 381.5 minutes.  The total monitoring time was 382.0 minutes. Time spent in Supine position was 176.5 minutes.   Respiratory Events The study revealed a presence of 205 obstructive, - central, and - mixed apneas resulting in an Apnea index of 32.2 events per hour.  There were 34 hypopneas (>=3% desaturation and/or arousal) resulting in an Apnea\Hypopnea Index (AHI >=3% desaturation and/or arousal) of 37.5 events per hour.  There were 18 hypopneas (>=4% desaturation) resulting in an Apnea\Hypopnea Index (AHI >=4% desaturation) of 35.0 events per hour.  There were - Respiratory Effort Related Arousals resulting in a RERA index of - events per hour. The Respiratory Disturbance Index is 37.5 events per hour.  The snore index was - events per hour.  Mean oxygen saturation was  96.5%.  The lowest oxygen saturation during monitoring time was 90.0%.  Time spent <=88% oxygen saturation was - minutes (-).  Cardiac Summary The average pulse rate was 78.2 bpm.  The minimum pulse rate was 66.0 bpm while the maximum pulse rate was 94.0 bpm.  Cardiac rhythm was normal  Comments: Severe obstructive sleep apnea, AHI (4%) 35/hr. Snoring with O2 desaturation to a nadir of 90%, mean 96.5%.  Diagnosis: Obstructive sleep apnea  Recommendations: Suggest autopap 5-20 or CPAP titration sleep study. Other options would be based on clinical judgment.   This study was personally reviewed and electronically signed by: Dr. RAMA JONETTA NEYSA Accredited Board Certified in Sleep Medicine Date/Time: 02/13/24  1:47    %%en%    Study Overview  Recording Time: 500.8 min. Monitoring Time: 382.0 min.  Analysis Start:  10:57:17 PM Supine Time: 176.5 min.  Analysis Stop:  05:18:49 AM     Study Summary   Count Index Longest Event Duration  Apneas & Hypopneas: 239 37.5  Apneas: 112.2 sec.     Hypopneas: 110.6 sec.  RERAs: - - - sec.  Desaturations: 136 21.4 96.2 sec.  Snores: - - - sec.    Minimum Oxygen Saturation: 90.0%    Respiratory Summary   Total Duration Supine Non-Supine   Count Index Average Longest Count Index Count Index  Obstructive Apnea 205 32.2 30.5 112.2 143 48.6 62 18.1   Mixed Apnea - - - - - - - -   Central Apnea - - - - - - - -   Total Apneas 205 32.2 30.5 112.2 143 48.6 62 18.1  Hypopneas 3% 34 5.3 N.A. N.A. 5 1.7 29 8.5   Apneas & Hyp. 3% 239 37.5 N.A. N.A. 148 50.3 91 26.6            Hypopneas 4% 18 2.8 N.A. N.A. 5 1.7 13 3.8  Apneas & Hyp. 4% 223 35.0 N.A. N.A. 148 50.3 75 21.9             RERAs - - - - - - - -  RDI 239 37.5 N.A. N.A. 148 50.3 91 26.6   Oxygen Saturation Summary   Total Supine Non-Supine  Average SpO2 96.5% 96.5% 96.4%  Minimum SpO2 90.0% 90.0% 90.0%   Maximum SpO2 100.0% 100.0% 100.0%   Oxygen Saturation  Distribution  Range (%) Time in range (min) Time in range (%)  90.0 - 100.0 381.4 100.0%  80.0 - 90.0 0.2 0.0%  70.0 - 80.0 - -  60.0 - 70.0 - -  50.0 - 60.0 - -  0.0 - 50.0 - -  Time Spent <=88% SpO2  Range (%) Time in range (min) Time in range (%)  0.0 - 88.0 - -  Cardiac Summary   Total Supine Non-Supine  Average Pulse Rate (BPM) 78.2 78.8 77.8  Minimum Pulse Rate (BPM) 66.0 66.0 69.0  Maximum Pulse Rate (BPM) 94.0 92.0 94.0                      Technologist Comments  -                        Reggy Neysa Bateman, Biomedical engineer of Sleep Medicine  ELECTRONICALLY SIGNED ON:  02/13/2024, 1:44 PM [facility] PH: [phone]   FX: [phone] ACCREDITED BY THE AMERICAN ACADEMY OF SLEEP MEDICINE

## 2024-02-22 NOTE — Progress Notes (Signed)
 History of present illness:   Patient is a pleasant right-hand-dominant 63 year old male who comes in today for follow-up and MRI review of his right shoulder/arm. His history is previously well-documented but briefly, he was initially seen in our office on 09/10/2023 with a 3-week history of acute onset shoulder pain that began when he felt a sharp pain in his shoulder and arm when working on a car. He developed some bruising and was ultimately seen in our office on 09/10/2023 which point we felt we he had likely sustained a proximal biceps rupture and we elected to continue to monitor.  He then followed back up with us  on 02/23/2024 at which point he reported that he had some ongoing pain and discomfort to his shoulder that he was rating at 7/10.  He was noticing some difficulty and weakness reaching out in front of him as well as overhead.  We had elected to move forward with MRI evaluation which was obtained at [facility] on 02/15/2024 and demonstrated complete tear of the supraspinatus and anterior aspect of the infraspinatus with retraction to the mid humeral head with a proximal biceps rupture, partial tearing of the upper border of the subscapularis as well is what appears to be degenerative labral tearing and mild degenerative changes to the glenohumeral and AC joints.  Today, patient reports that he has ongoing pain to his shoulder that is unchanged.  He notices weakness as well as discomfort worse with overhead activities.  The pain does interfere with his ability to sleep at night.  Past Medical History[1]  Past Surgical History[2]  Current Medications[3]  Allergies[4]  Family History[5]  Social History   Occupational History  . Not on file  Tobacco Use  . Smoking status: Never  . Smokeless tobacco: Never  Substance and Sexual Activity  . Alcohol use: Never  . Drug use: Never  . Sexual activity: Not on file    10-point ROS negative unless as stated above.  Physical exam: BP 117/76    Pulse 82   Wt (!) 113.4 kg (250 lb)   BMI 36.92 kg/m :   GEN:  NAD; Sitting comfortably in office, pleasant. HEAD: Normocephalic, atraumatic. EYES: Sclera non-icteric EOMI. ENT: Ears externally normal. No nasal congestion.  CARDIAC: Regular rate, distal pulses intact. LUNGS: Normal respiratory effort. No retractions or cough noted. NEURO: No focal neurologic deficits noted. PSYCH: Appropriate affect. Alert and oriented. DERM: Warm, dry. Appropriate turgor.  MSK:  RUE: No erythema, swelling or deformity shoulder.  No atrophy appreciated though muscle mass limits eval of supraspinatus infraspinatus fossa.  Shoulder elevation to 120 degrees with a mildly painful arc.  In abduction, he has 80 degrees of external rotation and 0 degrees of internal rotation.  He can internally rotate to his back it.  External rotation side to 20 degrees.  He has 4/5 strength in abduction and external rotation but negative lag signs.  Positive Hawkins and O'Brien's to the lateral and posterior aspects of the shoulder.  Tenderness to the posterior joint line as well as laterally adjacent to the acromion.  No AC joint tenderness or anterior joint line tenderness.    Imaging: No new images obtained in office today  Assessment/plan: Patient's recent MRI was reviewed with him in clinic today.  He has a tear of his supraspinatus with retraction to the mid humeral head as well as tearing of a portion of the anterior aspect of the infraspinatus and what appears to be partial-thickness tearing of the subscapularis and a proximal  biceps rupture as previously noted.  We reviewed over treatment options including nonoperative measures with pain control measures, activity modification, and therapy versus operative intervention in the form of a shoulder arthroscopy with rotator cuff repair.  We discussed the typical postoperative course with surgery including the need for immobilization and formal therapy which can take 6 or more  months.  We discussed that with time rotator cuff tears tend to increase in size and that ultimately this could make it difficult to perform a primary rotator cuff repair and he may require some type of augmented procedure such as a tendon transfer, superior capsular reconstruction or, potentially, a reverse shoulder arthroplasty.  At this point, patient would like to take some time to consider these options and will reach out to us  in the coming weeks.  In the interim, we will provide him with a course of muscle relaxers to see if this helps with some of his nighttime symptoms.  If the patient develops any new or worsening signs/symptoms or otherwise has any questions, he is advised to reach out to us  to discuss.  Gerlene Gobble, DO [facility] [address] P: [phone] F: [phone]       [1] No past medical history on file. [2] No past surgical history on file. [3] Current Outpatient Medications  Medication Sig Dispense Refill  . acetaminophen  (TYLENOL ) 500 MG tablet Take 1 tablet (500 mg total) by mouth.    . atorvastatin  (LIPITOR) 20 MG tablet Take 1 tablet (20 mg total) by mouth.    . cholecalciferol, vitamin D3 25 mcg, 1,000 units,, 1,000 unit (25 mcg) tablet Take 1 tablet (25 mcg total) by mouth.    SABRA glipiZIDE (GLUCOTROL) 5 MG tablet Take 2 tablets (10 mg total) by mouth.    . hydroCHLOROthiazide  (HYDRODIURIL ) 25 MG tablet Take 1 tablet (25 mg total) by mouth.    . latanoprost  (XALATAN ) 0.005 % ophthalmic solution Apply 1 drop to eye at bedtime.    . losartan  (COZAAR ) 50 MG tablet Take 0.5 tablets (25 mg total) by mouth.    . metFORMIN (GLUCOPHAGE) 1000 MG tablet Take 1 tablet (1,000 mg total) by mouth two (2) times a day.    . methocarbamol (ROBAXIN) 500 MG tablet Take 2 tablets (1,000 mg total) by mouth Three (3) times a day as needed. 60 tablet 0  . pioglitazone (ACTOS) 30 MG tablet 1 tablet (30 mg total).    . semaglutide  (OZEMPIC) 1 mg/dose (4 mg/3 mL) PnIj injection Inject under the skin every seven (7) days.     No current facility-administered medications for this visit.  [4] Allergies Allergen Reactions  . Sulfa (Sulfonamide Antibiotics) Swelling  [5] No family history on file.

## 2024-03-09 ENCOUNTER — Encounter: Payer: Self-pay | Admitting: *Deleted

## 2024-03-09 NOTE — Progress Notes (Signed)
 SCP completed and mailed to pt.

## 2024-03-14 ENCOUNTER — Encounter: Payer: Self-pay | Admitting: *Deleted

## 2024-03-14 ENCOUNTER — Inpatient Hospital Stay: Attending: Adult Health | Admitting: *Deleted

## 2024-03-14 DIAGNOSIS — C61 Malignant neoplasm of prostate: Secondary | ICD-10-CM

## 2024-03-14 NOTE — Progress Notes (Signed)
 Scp reviewed and completed.

## 2024-04-15 IMAGING — CT NM PET TUM IMG SKULL BASE T - THIGH
1 of 7 series · 1 of 25 positions shown · non-contrast
Comparison: Prostate MRI 02/28/2021

CLINICAL DATA: Prostate carcinoma with biochemical recurrence.

EXAM:
NUCLEAR MEDICINE PET SKULL BASE TO THIGH
TECHNIQUE: 9.0 mCi F18 Piflufolastat (Pylarify) was injected intravenously.
Full-ring PET imaging was performed from the skull base to thigh
after the radiotracer. CT data was obtained and used for attenuation
correction and anatomic localization.

[Series 3: pet sk_thigh ac · axial · 5.0mm · 4.07mm/px · 1 of 253 slices shown]
[im 127/253]
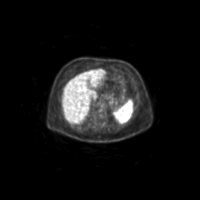

[1 of 25 positions shown; findings below may reference images not displayed]

FINDINGS: NECK

No radiotracer activity in neck lymph nodes.

Incidental CT finding: None

CHEST

No radiotracer accumulation within mediastinal or hilar lymph nodes.
No suspicious pulmonary nodules on the CT scan.

Incidental CT finding: None

ABDOMEN/PELVIS

Prostate: Intense activity within the LEFT lobe of the prostate
gland with SUV max equal 11.4. This is a broad lesion involving
large portion of the LEFT gland.

Lymph nodes: No abnormal radiotracer accumulation within pelvic or
abdominal nodes.

Liver: No evidence of liver metastasis

Incidental CT finding: None

SKELETON

No focal  activity to suggest skeletal metastasis.
IMPRESSION: 1. No evidence local prostate cancer recurrence in the prostatectomy
bed.
2. No evidence metastatic adenopathy in the pelvis or periaortic
retroperitoneum.
3. No evidence of visceral metastasis or skeletal metastasis.

## 2024-04-19 ENCOUNTER — Encounter: Payer: Self-pay | Admitting: Orthopedic Surgery

## 2024-04-19 ENCOUNTER — Ambulatory Visit: Admitting: Orthopedic Surgery

## 2024-04-19 DIAGNOSIS — S46011A Strain of muscle(s) and tendon(s) of the rotator cuff of right shoulder, initial encounter: Secondary | ICD-10-CM

## 2024-04-19 NOTE — Progress Notes (Unsigned)
 Office Visit Note   Patient: William Cervantes           Date of Birth: 1960-11-07           MRN: [id number] Visit Date: 04/19/2024 Requested by: Canda Franky Jurist, MD [address] [place],  [place] [address] PCP: Henry Tanda FORBES Mickey., MD  Subjective: Chief Complaint  Patient presents with   Right Shoulder - Pain    HPI: William Cervantes is a 63 y.o. male who presents to the office reporting right shoulder pain.  Patient states that in February of this year he was closing his out of a car and felt a pop in his shoulder.  Pictures he shows do depict proximal biceps rupture.  That has improved but his pain and weakness have persisted in the shoulder region.  He is right-hand dominant.  Describes constant pain which does wake him from sleep most nights.  Has little bit of neck pain as well.  He has had injections and physical therapy.  Hemoglobin A1c is 6.7.  Ibuprofen and Tylenol  helps some.  No cardiac history.  Does have a history of prostate cancer for which he received some radiation treatment.  Really hard for him to sleep and hard for him to lift anything particularly overhead.  Describes both weakness and pain.  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  MRI scan on a disk is reviewed.  Report also reviewed.  He does have a supraspinatus tear with retraction to the top of the humeral head with no atrophy.  Not too much in terms of significant arthritis..                ROS: All systems reviewed are negative as they relate to the chief complaint within the history of present illness.  Patient denies fevers or chills.  Assessment & Plan: Visit Diagnoses:  1. Traumatic complete tear of right rotator cuff, initial encounter     Plan: Impression is medium size rotator cuff tear in a patient who does have some weakness but reasonably maintained passive range of motion.  Slightly less on the right compared to the left.  I think this is a fixable tear.  His diabetes is  well-controlled.  Showed him with the use of pictures as well as with reviewing of his MRI scan and the rationale behind mobilizing the tendon edge to pull it back down and fix it with medial and lateral row repair.  Expected rehab time discussed.  I think he would do well with CPM for 2 weeks.  The risk and benefits of surgery discussed include not limited to infection or vessel damage shoulder stiffness as well as incomplete pain relief and incomplete restoration of function.  Expected rehab time is also discussed.  All questions answered.  He will likely get this done sometime in January which based on retraction at this time should not adversely affect his ultimate result  Follow-Up Instructions: No follow-ups on file.   Orders:  No orders of the defined types were placed in this encounter.  No orders of the defined types were placed in this encounter.     Procedures: No procedures performed   Clinical Data: No additional findings.  Objective: Vital Signs: There were no vitals taken for this visit.  Physical Exam:  Constitutional: Patient appears well-developed HEENT:  Head: Normocephalic Eyes:EOM are normal Neck: Normal range of motion Cardiovascular: Normal rate Pulmonary/chest: Effort normal Neurologic: Patient is alert Skin: Skin  is warm Psychiatric: Patient has normal mood and affect  Ortho Exam: Ortho exam demonstrates range of motion on the right 50/90/150 range of motion on the left 60/100/170.  On the right he does have external rotation strength of 4 out of 5 compared to 5 out of 5 on the left.  Popeye deformity present on the right.  Does have some coarseness and popping which is mild on the right-hand side but not present on the left.  Deltoid is functional.  No paresthesias C5-T1.  Radial pulse intact.  Specialty Comments:  No specialty comments available.  Imaging: No results found.   PMFS History: Patient Active Problem List   Diagnosis Date Noted    Prostate cancer (HCC) 05/08/2022   Chronic liver disease 04/21/2019   Colon cancer screening 04/21/2019   Past Medical History:  Diagnosis Date   Arthritis    hands   Cancer (HCC)    prostate   Diabetes mellitus (HCC)    ED (erectile dysfunction)    Elevated PSA    Glaucoma    mother had it   History of kidney stones    Hyperlipidemia    Hypertension    Vitamin D deficiency     Family History  Problem Relation Age of Onset   Diabetes Mother    Hypertension Father    Heart attack Father        passed away at 13 with massive heart attack   Colon cancer Neg Hx    Liver disease Neg Hx     Past Surgical History:  Procedure Laterality Date   APPENDECTOMY     LYMPH NODE DISSECTION Bilateral 05/08/2022   Procedure: PELVIC LYMPH NODE DISSECTION;  Surgeon: Alvaro Hummer, MD;  Location: [facility];  Service: Urology;  Laterality: Bilateral;   ROBOT ASSISTED LAPAROSCOPIC RADICAL PROSTATECTOMY N/A 05/08/2022   Procedure: XI ROBOTIC ASSISTED LAPAROSCOPIC RADICAL PROSTATECTOMY AND INDOCYANINE GREEN DYE INJECTION;  Surgeon: Alvaro Hummer, MD;  Location: [facility];  Service: Urology;  Laterality: N/A;  3 HRS   Social History   Occupational History   Not on file  Tobacco Use   Smoking status: Never   Smokeless tobacco: Never  Vaping Use   Vaping status: Not on file  Substance and Sexual Activity   Alcohol use: Not Currently    Comment: occ beer, never had heavy use.    Drug use: Never   Sexual activity: Yes

## 2024-05-01 ENCOUNTER — Encounter: Payer: Self-pay | Admitting: Radiology

## 2024-05-05 ENCOUNTER — Telehealth: Payer: Self-pay | Admitting: Orthopedic Surgery

## 2024-05-05 NOTE — Telephone Encounter (Signed)
 Pt states he needs a letter stating that he was seen on 04/19/24 and the he will be having shoulder surgery. Pt need to [facility] to [facility] to get [facility] for [facility]. Pt also wants a copy of his office notes from that date as well.    Please email to the email address on file in patient's chart.

## 2024-07-12 ENCOUNTER — Encounter: Admitting: Orthopedic Surgery

## 2024-09-05 ENCOUNTER — Ambulatory Visit (HOSPITAL_COMMUNITY): Admit: 2024-09-05 | Admitting: Orthopedic Surgery

## 2024-09-05 DIAGNOSIS — Z01818 Encounter for other preprocedural examination: Secondary | ICD-10-CM

## 2024-09-05 SURGERY — ARTHROSCOPY, SHOULDER WITH DEBRIDEMENT
Anesthesia: General | Site: Shoulder | Laterality: Right

## 2024-09-13 ENCOUNTER — Encounter: Admitting: Orthopedic Surgery
